# Patient Record
Sex: Female | Born: 1950 | Race: White | Hispanic: No | Marital: Single | Smoking: Light tobacco smoker
Health system: Southern US, Community
[De-identification: ages and names within clinical notes are randomized; demographics above are authoritative.]

## PROBLEM LIST (undated history)

## (undated) DIAGNOSIS — E559 Vitamin D deficiency, unspecified: Secondary | ICD-10-CM

## (undated) DIAGNOSIS — N189 Chronic kidney disease, unspecified: Secondary | ICD-10-CM

## (undated) DIAGNOSIS — R42 Dizziness and giddiness: Secondary | ICD-10-CM

## (undated) DIAGNOSIS — F419 Anxiety disorder, unspecified: Secondary | ICD-10-CM

## (undated) DIAGNOSIS — I10 Essential (primary) hypertension: Secondary | ICD-10-CM

## (undated) DIAGNOSIS — E785 Hyperlipidemia, unspecified: Secondary | ICD-10-CM

## (undated) DIAGNOSIS — J449 Chronic obstructive pulmonary disease, unspecified: Secondary | ICD-10-CM

## (undated) HISTORY — PX: OTHER SURGICAL HISTORY: SHX169

## (undated) HISTORY — PX: FOOT SURGERY: SHX648

## (undated) HISTORY — DX: Chronic obstructive pulmonary disease, unspecified: J44.9

## (undated) HISTORY — DX: Hyperlipidemia, unspecified: E78.5

## (undated) HISTORY — DX: Dizziness and giddiness: R42

## (undated) HISTORY — DX: Vitamin D deficiency, unspecified: E55.9

---

## 1998-03-19 HISTORY — PX: BREAST REDUCTION SURGERY: SHX8

## 1998-03-19 HISTORY — PX: REDUCTION MAMMAPLASTY: SUR839

## 2002-05-12 ENCOUNTER — Other Ambulatory Visit: Admission: RE | Admit: 2002-05-12 | Discharge: 2002-05-12 | Payer: Self-pay | Admitting: Gynecology

## 2005-12-03 ENCOUNTER — Ambulatory Visit (HOSPITAL_COMMUNITY): Admission: RE | Admit: 2005-12-03 | Discharge: 2005-12-03 | Payer: Self-pay | Admitting: Gastroenterology

## 2005-12-03 ENCOUNTER — Encounter (INDEPENDENT_AMBULATORY_CARE_PROVIDER_SITE_OTHER): Payer: Self-pay | Admitting: *Deleted

## 2005-12-03 ENCOUNTER — Ambulatory Visit: Payer: Self-pay | Admitting: Gastroenterology

## 2008-04-05 ENCOUNTER — Ambulatory Visit (HOSPITAL_COMMUNITY): Admission: RE | Admit: 2008-04-05 | Discharge: 2008-04-05 | Payer: Self-pay | Admitting: Family Medicine

## 2009-04-28 ENCOUNTER — Ambulatory Visit (HOSPITAL_COMMUNITY): Admission: RE | Admit: 2009-04-28 | Discharge: 2009-04-28 | Payer: Self-pay | Admitting: Family Medicine

## 2010-04-09 ENCOUNTER — Encounter: Payer: Self-pay | Admitting: Family Medicine

## 2010-06-06 ENCOUNTER — Other Ambulatory Visit (HOSPITAL_COMMUNITY): Payer: Self-pay | Admitting: *Deleted

## 2010-06-06 DIAGNOSIS — Z139 Encounter for screening, unspecified: Secondary | ICD-10-CM

## 2010-06-09 ENCOUNTER — Ambulatory Visit (HOSPITAL_COMMUNITY): Payer: PRIVATE HEALTH INSURANCE

## 2010-06-12 ENCOUNTER — Ambulatory Visit (HOSPITAL_COMMUNITY)
Admission: RE | Admit: 2010-06-12 | Discharge: 2010-06-12 | Disposition: A | Discharge status: Home / Self Care | Payer: PRIVATE HEALTH INSURANCE | Source: Ambulatory Visit | Attending: Family Medicine | Admitting: Family Medicine

## 2010-06-12 DIAGNOSIS — Z1231 Encounter for screening mammogram for malignant neoplasm of breast: Secondary | ICD-10-CM | POA: Insufficient documentation

## 2010-06-12 DIAGNOSIS — Z139 Encounter for screening, unspecified: Secondary | ICD-10-CM

## 2010-08-04 NOTE — Op Note (Signed)
NAME:  Samantha Bruce, Samantha Bruce           ACCOUNT NO.:  [id number]   MEDICAL RECORD NO.:  [id number]          PATIENT TYPE:  AMB   LOCATION:  DAY                           FACILITY:  [facility]   PHYSICIAN:  Kassie Mends, M.D.      DATE OF BIRTH:  Oct 24, 1950   DATE OF PROCEDURE:  12/03/2005  DATE OF DISCHARGE:                                 OPERATIVE REPORT   PROCEDURE:  Colonoscopy with cold forceps polypectomy.   INDICATION FOR EXAM:  Samantha Bruce is a 60 year old female who presents for  average risk colon cancer screening.   FINDINGS:  1. Two,  4-mm, sessile, rectal polyps removed via cold forceps biopsies.      Otherwise no masses, inflammatory changes, or vascular ectasia seen.  2. No diverticula or hemorrhoids evident.   RECOMMENDATIONS:  1..  High fiber diet and polyp handout given.  1. Will call patient with biopsy results.  If polyps adenomatous, then      next screening colonoscopy should be in 5 years.  All first degree      relatives (siblings and children) will need colon cancer screening at      age 75 if polyps are adenomatous.  2. Follow up with Dr. Samuel Jester.   MEDICATIONS:  1. Demerol 100 mg IV.  2. Versed 5 mg IV.   PROCEDURE TECHNIQUE:  A physical exam was performed and informed consent was  obtained from the patient after explaining the benefits, risks, and  alternatives to the procedure.  The patient was connected to the monitor and  placed in the left lateral position.  Continuous oxygen was provided by  nasal cannula and IV meds and administered via an indwelling cannula.  After  sedation and rectal exam, the patient's rectum was intubated.  The scope was  advanced under direct visualization to the cecum.  The scope was  subsequently removed slowly by carefully examining the color, texture,  anatomy, and integrity of the mucosa on the way out.  The patient was  recovered in the endoscopy suite; and discharged home in satisfactory  condition.      Kassie Mends, M.D.  Electronically Signed     SM/MEDQ  D:  12/04/2005  T:  12/05/2005  Job:  [id number]   cc:   Samuel Jester  Fax: [phone]

## 2012-04-08 ENCOUNTER — Other Ambulatory Visit (HOSPITAL_COMMUNITY): Payer: Self-pay | Admitting: Nurse Practitioner

## 2012-04-08 DIAGNOSIS — Z139 Encounter for screening, unspecified: Secondary | ICD-10-CM

## 2012-04-10 ENCOUNTER — Ambulatory Visit (HOSPITAL_COMMUNITY)
Admission: RE | Admit: 2012-04-10 | Discharge: 2012-04-10 | Disposition: A | Discharge status: Home / Self Care | Payer: PRIVATE HEALTH INSURANCE | Source: Ambulatory Visit | Attending: Nurse Practitioner | Admitting: Nurse Practitioner

## 2012-04-10 DIAGNOSIS — Z139 Encounter for screening, unspecified: Secondary | ICD-10-CM

## 2013-06-04 ENCOUNTER — Other Ambulatory Visit (HOSPITAL_COMMUNITY): Payer: Self-pay | Admitting: *Deleted

## 2013-06-04 DIAGNOSIS — Z1231 Encounter for screening mammogram for malignant neoplasm of breast: Secondary | ICD-10-CM

## 2013-06-09 ENCOUNTER — Ambulatory Visit (HOSPITAL_COMMUNITY)
Admission: RE | Admit: 2013-06-09 | Discharge: 2013-06-09 | Disposition: A | Discharge status: Home / Self Care | Payer: Medicare HMO | Source: Ambulatory Visit | Attending: *Deleted | Admitting: *Deleted

## 2013-06-09 DIAGNOSIS — Z1231 Encounter for screening mammogram for malignant neoplasm of breast: Secondary | ICD-10-CM | POA: Insufficient documentation

## 2013-06-12 ENCOUNTER — Other Ambulatory Visit (HOSPITAL_COMMUNITY): Payer: Self-pay | Admitting: *Deleted

## 2013-06-12 DIAGNOSIS — Z8249 Family history of ischemic heart disease and other diseases of the circulatory system: Secondary | ICD-10-CM

## 2013-06-12 DIAGNOSIS — I10 Essential (primary) hypertension: Secondary | ICD-10-CM

## 2013-06-18 ENCOUNTER — Ambulatory Visit (HOSPITAL_COMMUNITY)
Admission: RE | Admit: 2013-06-18 | Discharge: 2013-06-18 | Disposition: A | Discharge status: Home / Self Care | Payer: Medicare HMO | Source: Ambulatory Visit | Attending: *Deleted | Admitting: *Deleted

## 2013-06-18 DIAGNOSIS — Z8489 Family history of other specified conditions: Secondary | ICD-10-CM | POA: Insufficient documentation

## 2013-06-18 DIAGNOSIS — Z8249 Family history of ischemic heart disease and other diseases of the circulatory system: Secondary | ICD-10-CM

## 2013-06-18 DIAGNOSIS — I10 Essential (primary) hypertension: Secondary | ICD-10-CM | POA: Insufficient documentation

## 2013-08-28 ENCOUNTER — Encounter (HOSPITAL_COMMUNITY): Payer: Self-pay | Admitting: Emergency Medicine

## 2013-08-28 ENCOUNTER — Emergency Department (HOSPITAL_COMMUNITY)
Admission: EM | Admit: 2013-08-28 | Discharge: 2013-08-28 | Disposition: A | Discharge status: Home / Self Care | Payer: Medicare HMO | Attending: Emergency Medicine | Admitting: Emergency Medicine

## 2013-08-28 DIAGNOSIS — F17200 Nicotine dependence, unspecified, uncomplicated: Secondary | ICD-10-CM | POA: Insufficient documentation

## 2013-08-28 DIAGNOSIS — M542 Cervicalgia: Secondary | ICD-10-CM | POA: Insufficient documentation

## 2013-08-28 DIAGNOSIS — T148XXA Other injury of unspecified body region, initial encounter: Secondary | ICD-10-CM

## 2013-08-28 DIAGNOSIS — IMO0002 Reserved for concepts with insufficient information to code with codable children: Secondary | ICD-10-CM | POA: Insufficient documentation

## 2013-08-28 DIAGNOSIS — Z79899 Other long term (current) drug therapy: Secondary | ICD-10-CM | POA: Insufficient documentation

## 2013-08-28 DIAGNOSIS — Y929 Unspecified place or not applicable: Secondary | ICD-10-CM | POA: Insufficient documentation

## 2013-08-28 DIAGNOSIS — Y939 Activity, unspecified: Secondary | ICD-10-CM | POA: Insufficient documentation

## 2013-08-28 DIAGNOSIS — I10 Essential (primary) hypertension: Secondary | ICD-10-CM | POA: Insufficient documentation

## 2013-08-28 DIAGNOSIS — R51 Headache: Secondary | ICD-10-CM | POA: Insufficient documentation

## 2013-08-28 DIAGNOSIS — M25512 Pain in left shoulder: Secondary | ICD-10-CM

## 2013-08-28 DIAGNOSIS — X58XXXA Exposure to other specified factors, initial encounter: Secondary | ICD-10-CM | POA: Insufficient documentation

## 2013-08-28 DIAGNOSIS — Z791 Long term (current) use of non-steroidal anti-inflammatories (NSAID): Secondary | ICD-10-CM | POA: Insufficient documentation

## 2013-08-28 HISTORY — DX: Essential (primary) hypertension: I10

## 2013-08-28 MED ORDER — BACLOFEN 10 MG PO TABS: 10.0000 mg | ORAL_TABLET | Freq: Three times a day (TID) | ORAL | Status: AC

## 2013-08-28 MED ORDER — MELOXICAM 7.5 MG PO TABS: ORAL_TABLET | ORAL | Status: DC

## 2013-08-28 MED ORDER — ACETAMINOPHEN-CODEINE #3 300-30 MG PO TABS: 1.0000 | ORAL_TABLET | Freq: Four times a day (QID) | ORAL | Status: DC | PRN

## 2013-08-28 NOTE — ED Notes (Signed)
Pt has not taken b/p medicine this morning.

## 2013-08-28 NOTE — ED Provider Notes (Signed)
CSN: [id number]     Arrival date & time 08/28/13  [id number] History   First MD Initiated Contact with Patient 08/28/13 [phone]     Chief Complaint  Patient presents with  . Shoulder Pain     (Consider location/radiation/quality/duration/timing/severity/associated sxs/prior Treatment) HPI Comments: Left hand dominant.  Hx of bone spurs on the neck 10 yrs ago, surgery suggested, but never done.  Patient is a 63 y.o. female presenting with shoulder pain. The history is provided by the patient.  Shoulder Pain This is a chronic problem. The current episode started more than 1 year ago. The problem occurs intermittently. The problem has been gradually worsening. Associated symptoms include arthralgias, headaches and neck pain. Pertinent negatives include no abdominal pain, chest pain or coughing. Associated symptoms comments: Pain behind the head and extending from the shoulder to the neck. Collar bone also sore.. Exacerbated by: raising the left hand and movement. Treatments tried: tramadol and naprosyn. The treatment provided mild relief.    Past Medical History  Diagnosis Date  . Hypertension    History reviewed. No pertinent past surgical history. History reviewed. No pertinent family history. History  Substance Use Topics  . Smoking status: Light Tobacco Smoker  . Smokeless tobacco: Not on file  . Alcohol Use: No   OB History   Grav Para Term Preterm Abortions TAB SAB Ect Mult Living                 Review of Systems  Constitutional: Negative for activity change.       All ROS Neg except as noted in HPI  HENT: Negative for nosebleeds.   Eyes: Negative for photophobia and discharge.  Respiratory: Negative for cough, shortness of breath and wheezing.   Cardiovascular: Negative for chest pain and palpitations.  Gastrointestinal: Negative for abdominal pain and blood in stool.  Genitourinary: Negative for dysuria, frequency and hematuria.  Musculoskeletal: Positive for arthralgias and  neck pain. Negative for back pain.  Skin: Negative.   Neurological: Positive for headaches. Negative for dizziness, seizures and speech difficulty.  Psychiatric/Behavioral: Negative for hallucinations and confusion.      Allergies  Vicodin  Home Medications   Prior to Admission medications   Medication Sig Start Date End Date Taking? Authorizing Provider  atenolol-chlorthalidone (TENORETIC) 50-25 MG per tablet Take 1 tablet by mouth daily. 07/06/13  Yes Historical Provider, MD  diclofenac sodium (VOLTAREN) 1 % GEL Apply 2 g topically 4 (four) times daily.   Yes Historical Provider, MD  diphenhydramine-acetaminophen (TYLENOL PM) 25-500 MG TABS Take 1 tablet by mouth at bedtime as needed. sleep   Yes Historical Provider, MD  naproxen (NAPROSYN) 500 MG tablet Take 1,000 mg by mouth 2 (two) times daily with a meal.   Yes Historical Provider, MD  traMADol (ULTRAM) 50 MG tablet Take 50 mg by mouth every 6 (six) hours as needed. pain 07/29/13   Historical Provider, MD   BP 180/82  Pulse 52  Temp(Src) 97.9 F (36.6 C) (Oral)  Resp 20  Ht 5\' 7"  (1.702 m)  Wt 189 lb (85.73 kg)  BMI 29.59 kg/m2  SpO2 97% Physical Exam  Nursing note and vitals reviewed. Constitutional: She is oriented to person, place, and time. She appears well-developed and well-nourished.  Non-toxic appearance.  HENT:  Head: Normocephalic.  Right Ear: Tympanic membrane and external ear normal.  Left Ear: Tympanic membrane and external ear normal.  Eyes: EOM and lids are normal. Pupils are equal, round, and reactive to light.  Neck:  Normal range of motion. Neck supple. Carotid bruit is not present.  Cardiovascular: Normal rate, regular rhythm, normal heart sounds, intact distal pulses and normal pulses.   Pulmonary/Chest: Breath sounds normal. No respiratory distress.    Tenderness and pain to palpation and attempted ROM of the left shoulder, along the anterior and posterior shoulder, extending under the left arm. No  pain of the right shoulder.  Abdominal: Soft. Bowel sounds are normal. There is no tenderness. There is no guarding.  Musculoskeletal: Normal range of motion.  Lymphadenopathy:       Head (right side): No submandibular adenopathy present.       Head (left side): No submandibular adenopathy present.    She has no cervical adenopathy.  Neurological: She is alert and oriented to person, place, and time. She has normal strength. No cranial nerve deficit or sensory deficit.  Skin: Skin is warm and dry.  Psychiatric: She has a normal mood and affect. Her speech is normal.    ED Course  Procedures (including critical care time) Labs Review Labs Reviewed - No data to display  Imaging Review No results found.   EKG Interpretation None      MDM Electrocardiogram is negative for any acute event. Vital signs are within normal limits with exception of the pulse rate being 50 to him the blood pressure being elevated at 180/82. Pulse oximetry is 97% on room air. Within normal limits by my interpretation.  Patient has pain with range of motion of the shoulder. She has muscle related pain under the scapula and under the clavicle extending under the axilla. Suspect the patient has degenerative joint disease, muscle strain, and possible rotator cuff injury. No evidence of acute dislocation, or fracture, or vascular compromise. I've instructed the patient as to why we were not doing an x-ray of her shoulder. I have encouraged her to see an orthopedic specialist to see if orthopedics feels she needs something more conclusive such as an MR of the shoulder. A prescription for baclofen, Mobic, and Tylenol with Codeine have been given to the patient. Patient will followup with her primary physician for orthopedic referral.    Final diagnoses:  None    **I have reviewed nursing notes, vital signs, and all appropriate lab and imaging results for this patient.Kathie Dike, PA-C 08/28/13 1116

## 2013-08-28 NOTE — ED Notes (Signed)
Right shoulder pain times one year.  Diagnosed with bursitis.  Now pain is going into neck and she has some tingling is in the back of her head.

## 2013-08-28 NOTE — Discharge Instructions (Signed)
Your electrocardiogram is well within normal limits. There no acute changes noted on the EKG. Your examination is consistent with arthritis and muscle strain. Please use Mobic and baclofen daily. Please take these medications with food. Please use Tylenol codeine every 6 hours as needed for pain, this medication along with baclofen may cause drowsiness, please use caution getting around when taking this medication. Please see Dr. Charm Barges, or her designated physician if any changes, or problems, or return to the emergency department if any emergent problems.

## 2013-08-28 NOTE — ED Provider Notes (Signed)
Medical screening examination/treatment/procedure(s) were performed by non-physician practitioner and as supervising physician I was immediately available for consultation/collaboration.   EKG Interpretation   Date/Time:  Friday August 28 2013 10:39:58 EDT Ventricular Rate:  50 PR Interval:  167 QRS Duration: 91 QT Interval:  412 QTC Calculation: 376 R Axis:   30 Text Interpretation:  Sinus rhythm Confirmed by Wilkie Aye  MD, COURTNEY  ([id number]) on 08/28/2013 10:45:47 AM        Shon Baton, MD 08/28/13 1305

## 2014-09-01 ENCOUNTER — Other Ambulatory Visit (HOSPITAL_COMMUNITY): Payer: Self-pay | Admitting: *Deleted

## 2014-09-01 DIAGNOSIS — Z1231 Encounter for screening mammogram for malignant neoplasm of breast: Secondary | ICD-10-CM

## 2014-09-13 ENCOUNTER — Ambulatory Visit (HOSPITAL_COMMUNITY)
Admission: RE | Admit: 2014-09-13 | Discharge: 2014-09-13 | Disposition: A | Discharge status: Home / Self Care | Payer: Medicare HMO | Source: Ambulatory Visit | Attending: *Deleted | Admitting: *Deleted

## 2014-09-13 ENCOUNTER — Other Ambulatory Visit (HOSPITAL_COMMUNITY): Payer: Self-pay | Admitting: *Deleted

## 2014-09-13 DIAGNOSIS — Z1231 Encounter for screening mammogram for malignant neoplasm of breast: Secondary | ICD-10-CM | POA: Diagnosis not present

## 2014-12-23 DIAGNOSIS — Z23 Encounter for immunization: Secondary | ICD-10-CM | POA: Diagnosis not present

## 2015-02-07 DIAGNOSIS — I10 Essential (primary) hypertension: Secondary | ICD-10-CM | POA: Diagnosis not present

## 2015-03-24 DIAGNOSIS — Z0100 Encounter for examination of eyes and vision without abnormal findings: Secondary | ICD-10-CM | POA: Diagnosis not present

## 2015-03-24 DIAGNOSIS — I10 Essential (primary) hypertension: Secondary | ICD-10-CM | POA: Diagnosis not present

## 2015-03-24 DIAGNOSIS — H5200 Hypermetropia, unspecified eye: Secondary | ICD-10-CM | POA: Diagnosis not present

## 2015-04-12 DIAGNOSIS — R69 Illness, unspecified: Secondary | ICD-10-CM | POA: Diagnosis not present

## 2015-05-27 DIAGNOSIS — H6121 Impacted cerumen, right ear: Secondary | ICD-10-CM | POA: Diagnosis not present

## 2015-05-27 DIAGNOSIS — E559 Vitamin D deficiency, unspecified: Secondary | ICD-10-CM | POA: Diagnosis not present

## 2015-05-27 DIAGNOSIS — J449 Chronic obstructive pulmonary disease, unspecified: Secondary | ICD-10-CM | POA: Diagnosis not present

## 2015-05-27 DIAGNOSIS — E782 Mixed hyperlipidemia: Secondary | ICD-10-CM | POA: Diagnosis not present

## 2015-05-27 DIAGNOSIS — I10 Essential (primary) hypertension: Secondary | ICD-10-CM | POA: Diagnosis not present

## 2015-05-27 DIAGNOSIS — E538 Deficiency of other specified B group vitamins: Secondary | ICD-10-CM | POA: Diagnosis not present

## 2015-07-06 DIAGNOSIS — H3562 Retinal hemorrhage, left eye: Secondary | ICD-10-CM | POA: Diagnosis not present

## 2015-07-19 DIAGNOSIS — L309 Dermatitis, unspecified: Secondary | ICD-10-CM | POA: Diagnosis not present

## 2015-07-19 DIAGNOSIS — H3562 Retinal hemorrhage, left eye: Secondary | ICD-10-CM | POA: Diagnosis not present

## 2015-07-19 DIAGNOSIS — M1712 Unilateral primary osteoarthritis, left knee: Secondary | ICD-10-CM | POA: Diagnosis not present

## 2015-07-20 DIAGNOSIS — G500 Trigeminal neuralgia: Secondary | ICD-10-CM | POA: Diagnosis not present

## 2015-10-13 DIAGNOSIS — I10 Essential (primary) hypertension: Secondary | ICD-10-CM | POA: Diagnosis not present

## 2015-10-13 DIAGNOSIS — E538 Deficiency of other specified B group vitamins: Secondary | ICD-10-CM | POA: Diagnosis not present

## 2015-10-13 DIAGNOSIS — E559 Vitamin D deficiency, unspecified: Secondary | ICD-10-CM | POA: Diagnosis not present

## 2015-10-13 DIAGNOSIS — E782 Mixed hyperlipidemia: Secondary | ICD-10-CM | POA: Diagnosis not present

## 2015-10-19 DIAGNOSIS — R69 Illness, unspecified: Secondary | ICD-10-CM | POA: Diagnosis not present

## 2015-10-26 ENCOUNTER — Encounter: Payer: Self-pay | Admitting: Cardiovascular Disease

## 2015-10-26 ENCOUNTER — Ambulatory Visit (INDEPENDENT_AMBULATORY_CARE_PROVIDER_SITE_OTHER): Payer: Medicare HMO | Admitting: Cardiovascular Disease

## 2015-10-26 VITALS — BP 180/100 | HR 64 | Ht 67.0 in | Wt 180.0 lb

## 2015-10-26 DIAGNOSIS — R0789 Other chest pain: Secondary | ICD-10-CM

## 2015-10-26 DIAGNOSIS — I10 Essential (primary) hypertension: Secondary | ICD-10-CM

## 2015-10-26 DIAGNOSIS — Z136 Encounter for screening for cardiovascular disorders: Secondary | ICD-10-CM | POA: Diagnosis not present

## 2015-10-26 DIAGNOSIS — R0609 Other forms of dyspnea: Secondary | ICD-10-CM | POA: Diagnosis not present

## 2015-10-26 MED ORDER — AMLODIPINE BESYLATE 10 MG PO TABS: 10.0000 mg | ORAL_TABLET | Freq: Every day | ORAL | 6 refills | Status: DC

## 2015-10-26 NOTE — Addendum Note (Signed)
Addended by: Lesle Chris on: 10/26/2015 03:18 PM   Modules accepted: Orders

## 2015-10-26 NOTE — Progress Notes (Signed)
CARDIOLOGY CONSULT NOTE  Patient ID: Samantha Bruce MRN: [id number] DOB/AGE: 1950-12-14 65 y.o.  Admit date: (Not on file) Primary Physician: Samuel Jester, DO Referring Physician:   Reason for Consultation: HTN  HPI: The patient is a 66 year old woman with hypertension.  She is here with her daughter who says her mother stays stressed-out all the time. The patient complains of intermittent retrosternal chest pains. She has exertional dyspnea halfway up a flight of stairs. She denies orthopnea and PND. She has been on amlodipine 5 mg for 10 days and Tenoretic for 8 years. Her systolic blood pressure is routinely higher than 200.  ECG performed in the office today which I personally reviewed demonstrates normal sinus rhythm with no ischemic ST segment or T-wave abnormalities, nor any arrhythmias.     Allergies  Allergen Reactions  . Vicodin [Hydrocodone-Acetaminophen] Rash    Current Outpatient Prescriptions  Medication Sig Dispense Refill  . acetaminophen-codeine (TYLENOL #3) 300-30 MG per tablet Take 1 tablet by mouth every 6 (six) hours as needed for moderate pain. 16 tablet 0  . amLODipine (NORVASC) 5 MG tablet Take 5 mg by mouth daily.    Marland Kitchen aspirin 81 MG tablet Take 81 mg by mouth daily.    Marland Kitchen atenolol-chlorthalidone (TENORETIC) 50-25 MG per tablet Take 1 tablet by mouth daily.    . diclofenac sodium (VOLTAREN) 1 % GEL Apply 2 g topically 4 (four) times daily.    Marland Kitchen escitalopram (LEXAPRO) 5 MG tablet Take 5 mg by mouth daily.    . naproxen (NAPROSYN) 500 MG tablet Take 1,000 mg by mouth 2 (two) times daily with a meal.    . Omega-3 Fatty Acids (FISH OIL) 1200 MG CAPS Take by mouth.    . traMADol (ULTRAM) 50 MG tablet Take 50 mg by mouth every 6 (six) hours as needed. pain     No current facility-administered medications for this visit.     Past Medical History:  Diagnosis Date  . Hypertension     Past Surgical History:  Procedure Laterality Date  .  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 Marital status: Married    Spouse name: N/A  . Number of children: N/A  . Years of education: N/A   Occupational History  . Not on file.   Social History Main Topics  . Smoking status: Light Tobacco Smoker  . Smokeless tobacco: Never Used  . Alcohol use No  . Drug use: No  . Sexual activity: Not on file   Other Topics Concern  . Not on file   Social History Narrative  . No narrative on file     No family history of premature CAD in 1st degree relatives.  Prior to Admission medications   Medication Sig Start Date End Date Taking? Authorizing Provider  acetaminophen-codeine (TYLENOL #3) 300-30 MG per tablet Take 1 tablet by mouth every 6 (six) hours as needed for moderate pain. 08/28/13  Yes Ivery Quale, PA-C  amLODipine (NORVASC) 5 MG tablet Take 5 mg by mouth daily.   Yes Historical Provider, MD  aspirin 81 MG tablet Take 81 mg by mouth daily.   Yes Historical Provider, MD  atenolol-chlorthalidone (TENORETIC) 50-25 MG per tablet Take 1 tablet by mouth daily. 07/06/13  Yes Historical Provider, MD  diclofenac sodium (VOLTAREN) 1 % GEL Apply 2 g topically 4 (four) times daily.  Yes Historical Provider, MD  escitalopram (LEXAPRO) 5 MG tablet Take 5 mg by mouth daily.   Yes Historical Provider, MD  naproxen (NAPROSYN) 500 MG tablet Take 1,000 mg by mouth 2 (two) times daily with a meal.   Yes Historical Provider, MD  Omega-3 Fatty Acids (FISH OIL) 1200 MG CAPS Take by mouth.   Yes Historical Provider, MD  traMADol (ULTRAM) 50 MG tablet Take 50 mg by mouth every 6 (six) hours as needed. pain 07/29/13  Yes Historical Provider, MD     Review of systems complete and found to be negative unless listed above in HPI     Physical exam Blood pressure (!) 180/100, pulse 64, height  (1.702 m), weight 180 lb (81.6 kg), SpO2 97 %. General: NAD Neck: No JVD, no  thyromegaly or thyroid nodule.  Lungs: Clear to auscultation bilaterally with normal respiratory effort. CV: Nondisplaced PMI. Regular rate and rhythm, normal S1/S2, no S3/S4, no murmur.  No peripheral edema.  No carotid bruit.  Normal pedal pulses.  Abdomen: Soft, nontender, no distention.  Skin: Intact without lesions or rashes.  Neurologic: Alert and oriented x 3.  Psych: Normal affect. Extremities: No clubbing or cyanosis.  HEENT: Normal.   ECG: Most recent ECG reviewed.  Labs:  No results found for: WBC, HGB, HCT, MCV, PLT No results for input(s): NA, K, CL, CO2, BUN, CREATININE, CALCIUM, PROT, BILITOT, ALKPHOS, ALT, AST, GLUCOSE in the last 168 hours.  Invalid input(s): LABALBU No results found for: CKTOTAL, CKMB, CKMBINDEX, TROPONINI No results found for: CHOL No results found for: HDL No results found for: LDLCALC No results found for: TRIG No results found for: CHOLHDL No results found for: LDLDIRECT       Studies: No results found.  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  2. Chest pain/DOE: Likely related to hypertensive heart disease. Will increase amlodipine to 10 mg daily. I will order a 2-D echocardiogram with Doppler to evaluate cardiac structure, function, and regional wall motion.  Dispo: fu 6 weeks.   Signed: Prentice Docker, M.D., F.A.C.C.  10/26/2015, 2:54 PM

## 2015-10-26 NOTE — Patient Instructions (Signed)
Medication Instructions:   Increase Norvasc to  daily.  Continue all other medications.    Labwork: none  Testing/Procedures:  Your physician has requested that you have an echocardiogram. Echocardiography is a painless test that uses sound waves to create images of your heart. It provides your doctor with information about the size and shape of your heart and how well your heart's chambers and valves are working. This procedure takes approximately one hour. There are no restrictions for this procedure.  Office will contact with results via phone or letter.    Follow-Up: 6 weeks   Any Other Special Instructions Will Be Listed Below (If Applicable).  If you need a refill on your cardiac medications before your next appointment, please call your pharmacy.

## 2015-11-17 ENCOUNTER — Other Ambulatory Visit: Payer: Self-pay

## 2015-11-17 ENCOUNTER — Ambulatory Visit (INDEPENDENT_AMBULATORY_CARE_PROVIDER_SITE_OTHER): Payer: Medicare HMO

## 2015-11-17 DIAGNOSIS — I10 Essential (primary) hypertension: Secondary | ICD-10-CM

## 2015-11-17 DIAGNOSIS — R0789 Other chest pain: Secondary | ICD-10-CM

## 2015-11-17 LAB — ECHOCARDIOGRAM COMPLETE
E/e' ratio: 15.22
EWDT: 185 ms
FS: 38 % (ref 28–44)
IVS/LV PW RATIO, ED: 0.81
LA diam end sys: 38 mm
LA diam index: 1.97 cm/m2
LA vol index: 23.4 mL/m2
LASIZE: 38 mm
LAVOL: 45.2 mL
LAVOLA4C: 46.6 mL
LV E/e' medial: 15.22
LV TDI E'LATERAL: 5.8
LV TDI E'MEDIAL: 5.42
LV dias vol index: 39 mL/m2
LV dias vol: 75 mL (ref 46–106)
LV e' LATERAL: 5.8 cm/s
LVEEAVG: 15.22
LVOT SV: 65 mL
LVOT VTI: 22.8 cm
LVOT area: 2.84 cm2
LVOT diameter: 19 mm
LVOTPV: 90.2 cm/s
LVSYSVOL: 24 mL (ref 14–42)
LVSYSVOLIN: 12 mL/m2
MV Dec: 185
MV Peak grad: 3 mmHg
MVPKAVEL: 107 m/s
MVPKEVEL: 88.3 m/s
PW: 10.8 mm — AB (ref 0.6–1.1)
RV TAPSE: 21.7 mm
Simpson's disk: 68
Stroke v: 51 ml

## 2015-11-18 ENCOUNTER — Telehealth: Payer: Self-pay | Admitting: *Deleted

## 2015-11-18 NOTE — Telephone Encounter (Signed)
Notes Recorded by Lesle Chris, LPN on 05/22/5730 at 9:28 AM EDT Patient notified and verbalized understanding. Copy to pmd. Follow up scheduled with Dr. Purvis Sheffield on 12/08/2015. ------  Notes Recorded by Jodelle Gross, NP on 11/17/2015 at 4:42 PM EDT Reviewed echo results. Some AoV calcifications, but no issues related to it. Good contraction with mild stiffening on relaxation related to history of hypertension. No changes in regimen.

## 2015-12-08 ENCOUNTER — Ambulatory Visit (INDEPENDENT_AMBULATORY_CARE_PROVIDER_SITE_OTHER): Payer: Medicare HMO | Admitting: Cardiovascular Disease

## 2015-12-08 ENCOUNTER — Other Ambulatory Visit (HOSPITAL_COMMUNITY): Payer: Self-pay | Admitting: *Deleted

## 2015-12-08 ENCOUNTER — Encounter: Payer: Self-pay | Admitting: Cardiovascular Disease

## 2015-12-08 VITALS — BP 132/102 | HR 65 | Ht 66.0 in | Wt 168.0 lb

## 2015-12-08 DIAGNOSIS — Z1231 Encounter for screening mammogram for malignant neoplasm of breast: Secondary | ICD-10-CM

## 2015-12-08 DIAGNOSIS — I10 Essential (primary) hypertension: Secondary | ICD-10-CM

## 2015-12-08 DIAGNOSIS — R0789 Other chest pain: Secondary | ICD-10-CM

## 2015-12-08 DIAGNOSIS — R0609 Other forms of dyspnea: Secondary | ICD-10-CM

## 2015-12-08 MED ORDER — VALSARTAN 80 MG PO TABS: 80.0000 mg | ORAL_TABLET | Freq: Every day | ORAL | 6 refills | Status: DC

## 2015-12-08 NOTE — Progress Notes (Signed)
      SUBJECTIVE: The patient returns for follow-up after undergoing cardiovascular testing performed for the evaluation of chest pain and exertional dyspnea.  Echocardiogram 11/17/15 normal left ventricular systolic function with upper normal left ventricular wall thickness, LVEF 60-65%, grade 1 diastolic dysfunction, mild mitral regurgitation, mitral annular calcification and sclerotic aortic annulus.  She just returned from a trip to [place] and feels fatigued. Chest pain and shortness of breath have improved.   Review of Systems: As per "subjective", otherwise negative.  Allergies  Allergen Reactions  . Vicodin [Hydrocodone-Acetaminophen] Rash    Current Outpatient Prescriptions  Medication Sig Dispense Refill  . acetaminophen-codeine (TYLENOL #3) 300-30 MG per tablet Take 1 tablet by mouth every 6 (six) hours as needed for moderate pain. 16 tablet 0  . amLODipine (NORVASC) 10 MG tablet Take 1 tablet (10 mg total) by mouth daily. 30 tablet 6  . aspirin 81 MG tablet Take 81 mg by mouth daily.    Marland Kitchen atenolol-chlorthalidone (TENORETIC) 50-25 MG per tablet Take 1 tablet by mouth daily.    . diclofenac sodium (VOLTAREN) 1 % GEL Apply 2 g topically 4 (four) times daily.    Marland Kitchen escitalopram (LEXAPRO) 5 MG tablet Take 5 mg by mouth daily.    . naproxen (NAPROSYN) 500 MG tablet Take 1,000 mg by mouth 2 (two) times daily with a meal.    . Omega-3 Fatty Acids (FISH OIL) 1200 MG CAPS Take by mouth.    . traMADol (ULTRAM) 50 MG tablet Take 50 mg by mouth every 6 (six) hours as needed. pain     No current facility-administered medications for this visit.     Past Medical History:  Diagnosis Date  . Hypertension     Past Surgical History:  Procedure Laterality Date  . 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 Marital status: Married    Spouse name: N/A  . Number of children: N/A  . Years of education: N/A    Occupational History  . Not on file.   Social History Main Topics  . Smoking status: Light Tobacco Smoker  . Smokeless tobacco: Never Used  . Alcohol use No  . Drug use: No  . Sexual activity: Not on file   Other Topics Concern  . Not on file   Social History Narrative  . No narrative on file     Vitals:   12/08/15 0914  BP: (!) 132/102  Pulse: 65  SpO2: 95%  Weight: 168 lb (76.2 kg)  Height: 5\' 6"  (1.676 m)    PHYSICAL EXAM General: NAD HEENT: Normal. Neck: No JVD, no thyromegaly. Lungs: Clear to auscultation bilaterally with normal respiratory effort. CV: Nondisplaced PMI.  Regular rate and rhythm, normal S1/S2, no S3/S4, no murmur. No pretibial or periankle edema.     Abdomen: Soft, nontender, no distention.  Neurologic: Alert and oriented.  Psych: Normal affect. Skin: Normal. Musculoskeletal: No gross deformities.    ECG: Most recent ECG reviewed.      ASSESSMENT AND PLAN: 1. Malignant HTN: DBP remains elevated. I will add Diovan 80 mg. Will obtain BMET in one week.  2. Chest pain/DOE: Symptoms have improved. Likely related to hypertensive heart disease. I will add Diovan 80 mg. Will obtain BMET in one week.  Dispo: fu 2 months.   Prentice Docker, M.D., F.A.C.C.

## 2015-12-08 NOTE — Patient Instructions (Signed)
Medication Instructions:   Begin Diovan 80mg  daily.  Continue all other medications.    Labwork: BMET - due in 5 days, order given today. Office will contact with results via phone or letter.    Testing/Procedures: none  Follow-Up: 2 months   Any Other Special Instructions Will Be Listed Below (If Applicable).  If you need a refill on your cardiac medications before your next appointment, please call your pharmacy.

## 2015-12-09 ENCOUNTER — Ambulatory Visit (HOSPITAL_COMMUNITY)
Admission: RE | Admit: 2015-12-09 | Discharge: 2015-12-09 | Disposition: A | Discharge status: Home / Self Care | Payer: Medicare HMO | Source: Ambulatory Visit | Attending: *Deleted | Admitting: *Deleted

## 2015-12-09 DIAGNOSIS — Z1231 Encounter for screening mammogram for malignant neoplasm of breast: Secondary | ICD-10-CM

## 2015-12-14 DIAGNOSIS — E538 Deficiency of other specified B group vitamins: Secondary | ICD-10-CM | POA: Diagnosis not present

## 2015-12-14 DIAGNOSIS — I10 Essential (primary) hypertension: Secondary | ICD-10-CM | POA: Diagnosis not present

## 2015-12-14 DIAGNOSIS — E782 Mixed hyperlipidemia: Secondary | ICD-10-CM | POA: Diagnosis not present

## 2015-12-14 DIAGNOSIS — E559 Vitamin D deficiency, unspecified: Secondary | ICD-10-CM | POA: Diagnosis not present

## 2015-12-15 DIAGNOSIS — I10 Essential (primary) hypertension: Secondary | ICD-10-CM | POA: Diagnosis not present

## 2015-12-23 ENCOUNTER — Telehealth: Payer: Self-pay | Admitting: Cardiovascular Disease

## 2015-12-23 NOTE — Telephone Encounter (Signed)
That is fine 

## 2015-12-23 NOTE — Telephone Encounter (Signed)
Pt says she doesn't want to start new medication. Says she wants to take 1/2 of valsartan. Will forward to Dr. Purvis SheffieldKoneswaran

## 2015-12-23 NOTE — Telephone Encounter (Signed)
Try Edarbi 80 mg 

## 2015-12-23 NOTE — Telephone Encounter (Signed)
Samantha Bruce called stating that since she stated taking the Diovan 80mg  daily she has noticed that she is very fatigue, wants to sleep and coughing.  States she is also having shortness of breath.    Please call # [phone].

## 2015-12-23 NOTE — Telephone Encounter (Signed)
LM to call back.

## 2015-12-26 NOTE — Telephone Encounter (Signed)
Pt aware will update [place] in 1 week if  Taking 1/2 relieves symptoms

## 2016-01-05 DIAGNOSIS — R69 Illness, unspecified: Secondary | ICD-10-CM | POA: Diagnosis not present

## 2016-02-13 ENCOUNTER — Ambulatory Visit: Payer: Medicare HMO | Admitting: Cardiovascular Disease

## 2016-02-29 DIAGNOSIS — E538 Deficiency of other specified B group vitamins: Secondary | ICD-10-CM | POA: Diagnosis not present

## 2016-02-29 DIAGNOSIS — I10 Essential (primary) hypertension: Secondary | ICD-10-CM | POA: Diagnosis not present

## 2016-02-29 DIAGNOSIS — E559 Vitamin D deficiency, unspecified: Secondary | ICD-10-CM | POA: Diagnosis not present

## 2016-02-29 DIAGNOSIS — R42 Dizziness and giddiness: Secondary | ICD-10-CM | POA: Diagnosis not present

## 2016-02-29 DIAGNOSIS — H3562 Retinal hemorrhage, left eye: Secondary | ICD-10-CM | POA: Diagnosis not present

## 2016-02-29 DIAGNOSIS — R69 Illness, unspecified: Secondary | ICD-10-CM | POA: Diagnosis not present

## 2016-02-29 DIAGNOSIS — R5382 Chronic fatigue, unspecified: Secondary | ICD-10-CM | POA: Diagnosis not present

## 2016-02-29 DIAGNOSIS — H8113 Benign paroxysmal vertigo, bilateral: Secondary | ICD-10-CM | POA: Diagnosis not present

## 2016-02-29 DIAGNOSIS — E782 Mixed hyperlipidemia: Secondary | ICD-10-CM | POA: Diagnosis not present

## 2016-02-29 DIAGNOSIS — J449 Chronic obstructive pulmonary disease, unspecified: Secondary | ICD-10-CM | POA: Diagnosis not present

## 2016-03-15 ENCOUNTER — Ambulatory Visit (INDEPENDENT_AMBULATORY_CARE_PROVIDER_SITE_OTHER): Payer: Medicare HMO | Admitting: Cardiovascular Disease

## 2016-03-15 ENCOUNTER — Encounter: Payer: Self-pay | Admitting: Cardiovascular Disease

## 2016-03-15 VITALS — BP 149/83 | HR 55 | Ht 66.0 in | Wt 185.0 lb

## 2016-03-15 DIAGNOSIS — R42 Dizziness and giddiness: Secondary | ICD-10-CM | POA: Diagnosis not present

## 2016-03-15 DIAGNOSIS — I10 Essential (primary) hypertension: Secondary | ICD-10-CM

## 2016-03-15 NOTE — Addendum Note (Signed)
Addended by: Burman Nieves T on: 03/15/2016 03:10 PM   Modules accepted: Orders

## 2016-03-15 NOTE — Patient Instructions (Signed)

## 2016-03-15 NOTE — Progress Notes (Addendum)
SUBJECTIVE: The patient presents for follow-up of malignant hypertension. She denies chest pain. She has a history of vertigo and has been dizzy lately. Atenolol dose recently increased by PCP.   Review of Systems: As per "subjective", otherwise negative.  Allergies  Allergen Reactions  . Vicodin [Hydrocodone-Acetaminophen] Rash    Current Outpatient Prescriptions  Medication Sig Dispense Refill  . acetaminophen-codeine (TYLENOL #3) 300-30 MG per tablet Take 1 tablet by mouth every 6 (six) hours as needed for moderate pain. 16 tablet 0  . amLODipine (NORVASC) 10 MG tablet Take 1 tablet (10 mg total) by mouth daily. 30 tablet 6  . aspirin 81 MG tablet Take 81 mg by mouth daily as needed.     Marland Kitchen. atenolol-chlorthalidone (TENORETIC) 50-25 MG per tablet Take 1 tablet by mouth daily.    . diclofenac sodium (VOLTAREN) 1 % GEL Apply 2 g topically 4 (four) times daily.    Marland Kitchen. escitalopram (LEXAPRO) 5 MG tablet Take 5 mg by mouth daily.    . naproxen (NAPROSYN) 500 MG tablet Take 1,000 mg by mouth 2 (two) times daily as needed.     . Omega-3 Fatty Acids (FISH OIL) 1200 MG CAPS Take by mouth.    . traMADol (ULTRAM) 50 MG tablet Take 50 mg by mouth every 6 (six) hours as needed. pain    . valsartan (DIOVAN) 80 MG tablet Take 1 tablet (80 mg total) by mouth daily. 30 tablet 6  . VENTOLIN HFA 108 (90 Base) MCG/ACT inhaler Inhale 2 puffs into the lungs every 4 (four) hours as needed.    Marland Kitchen. atenolol-chlorthalidone (TENORETIC) 100-25 MG tablet Take 1 tablet by mouth daily. Has not started this dose yet     No current facility-administered medications for this visit.     Past Medical History:  Diagnosis Date  . Hypertension     Past Surgical History:  Procedure Laterality Date  . 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 Marital status: Married    Spouse name: N/A  . Number of children: N/A  . Years of education:  N/A   Occupational History  . Not on file.   Social History Main Topics  . Smoking status: Light Tobacco Smoker    Types: Cigarettes    Start date: 04/16/1973  . Smokeless tobacco: Never Used  . Alcohol use No  . Drug use: No  . Sexual activity: Not on file   Other Topics Concern  . Not on file   Social History Narrative  . No narrative on file     Vitals:   03/15/16 1443  BP: (!) 149/83  Pulse: (!) 55  SpO2: 100%  Weight: 185 lb (83.9 kg)  Height: 5\' 6"  (1.676 m)    PHYSICAL EXAM General: NAD HEENT: Normal. Neck: No JVD, no thyromegaly. Lungs: Clear to auscultation bilaterally with normal respiratory effort. CV: Nondisplaced PMI.  Regular rate and rhythm, normal S1/S2, no S3/S4, no murmur. No pretibial or periankle edema.  No carotid bruit.   Abdomen: Soft, nontender, no distention.  Neurologic: Alert and oriented.  Psych: Normal affect. Skin: Normal. Musculoskeletal: No gross deformities.    ECG: Most recent ECG reviewed.      ASSESSMENT AND PLAN: 1. Malignant HTN: Mildly elevated. Atenolol dose recently increased by PCP. No changes today.  2. Vertigo: Will make ENT referral.  Dispo: fu 6 months  Darliss RidgelSuresh  Bronson Ing, M.D., F.A.C.C.

## 2016-06-08 DIAGNOSIS — Z0100 Encounter for examination of eyes and vision without abnormal findings: Secondary | ICD-10-CM | POA: Diagnosis not present

## 2016-06-08 DIAGNOSIS — H2510 Age-related nuclear cataract, unspecified eye: Secondary | ICD-10-CM | POA: Diagnosis not present

## 2016-06-19 DIAGNOSIS — R69 Illness, unspecified: Secondary | ICD-10-CM | POA: Diagnosis not present

## 2016-06-26 DIAGNOSIS — I10 Essential (primary) hypertension: Secondary | ICD-10-CM | POA: Diagnosis not present

## 2016-06-26 DIAGNOSIS — G933 Postviral fatigue syndrome: Secondary | ICD-10-CM | POA: Diagnosis not present

## 2016-06-26 DIAGNOSIS — Z6827 Body mass index (BMI) 27.0-27.9, adult: Secondary | ICD-10-CM | POA: Diagnosis not present

## 2016-06-26 DIAGNOSIS — E785 Hyperlipidemia, unspecified: Secondary | ICD-10-CM | POA: Diagnosis not present

## 2016-06-26 DIAGNOSIS — R42 Dizziness and giddiness: Secondary | ICD-10-CM | POA: Diagnosis not present

## 2016-06-26 DIAGNOSIS — E559 Vitamin D deficiency, unspecified: Secondary | ICD-10-CM | POA: Diagnosis not present

## 2016-06-26 DIAGNOSIS — Z79899 Other long term (current) drug therapy: Secondary | ICD-10-CM | POA: Diagnosis not present

## 2016-06-29 DIAGNOSIS — I10 Essential (primary) hypertension: Secondary | ICD-10-CM | POA: Diagnosis not present

## 2016-06-29 DIAGNOSIS — E559 Vitamin D deficiency, unspecified: Secondary | ICD-10-CM | POA: Diagnosis not present

## 2016-06-29 DIAGNOSIS — E785 Hyperlipidemia, unspecified: Secondary | ICD-10-CM | POA: Diagnosis not present

## 2016-06-29 DIAGNOSIS — Z79899 Other long term (current) drug therapy: Secondary | ICD-10-CM | POA: Diagnosis not present

## 2016-06-29 DIAGNOSIS — R42 Dizziness and giddiness: Secondary | ICD-10-CM | POA: Diagnosis not present

## 2016-06-29 DIAGNOSIS — G933 Postviral fatigue syndrome: Secondary | ICD-10-CM | POA: Diagnosis not present

## 2016-07-25 ENCOUNTER — Encounter: Payer: Self-pay | Admitting: *Deleted

## 2016-07-27 ENCOUNTER — Ambulatory Visit (INDEPENDENT_AMBULATORY_CARE_PROVIDER_SITE_OTHER): Payer: Medicare HMO | Admitting: Diagnostic Neuroimaging

## 2016-07-27 ENCOUNTER — Encounter: Payer: Self-pay | Admitting: Diagnostic Neuroimaging

## 2016-07-27 ENCOUNTER — Encounter (INDEPENDENT_AMBULATORY_CARE_PROVIDER_SITE_OTHER): Payer: Self-pay

## 2016-07-27 VITALS — BP 161/81 | HR 52 | Ht 67.0 in | Wt 175.4 lb

## 2016-07-27 DIAGNOSIS — G450 Vertebro-basilar artery syndrome: Secondary | ICD-10-CM

## 2016-07-27 DIAGNOSIS — G459 Transient cerebral ischemic attack, unspecified: Secondary | ICD-10-CM | POA: Diagnosis not present

## 2016-07-27 DIAGNOSIS — R42 Dizziness and giddiness: Secondary | ICD-10-CM

## 2016-07-27 NOTE — Patient Instructions (Signed)
Thank you for coming to see [place] at [facility]. I hope we have been able to provide you high quality care today.  You may receive a patient satisfaction survey over the next few weeks. We would appreciate your feedback and comments so that we may continue to improve ourselves and the health of our patients.  - check MRI brain, MRA head, MRA neck - continue aspirin 71m daily, fish oil, BP control meds - smoking cessation reviewed   ~~~~~~~~~~~~~~~~~~~~~~~~~~~~~~~~~~~~~~~~~~~~~~~~~~~~~~~~~~~~~~~~~  DR. Shawni Volkov'S GUIDE TO HAPPY AND HEALTHY LIVING These are some of my general health and wellness recommendations. Some of them may apply to you better than others. Please use common sense as you try these suggestions and feel free to ask me any questions.   ACTIVITY/FITNESS Mental, social, emotional and physical stimulation are very important for brain and body health. Try learning a new activity (arts, music, language, sports, games).  Keep moving your body to the best of your abilities. You can do this at home, inside or outside, the park, community center, gym or anywhere you like. Consider a physical therapist or personal trainer to get started. Consider the app Sworkit. Fitness trackers such as smart-watches, smart-phones or Fitbits can help as well.   NUTRITION Eat more plants: colorful vegetables, nuts, seeds and berries.  Eat less sugar, salt, preservatives and processed foods.  Avoid toxins such as cigarettes and alcohol.  Drink water when you are thirsty. Warm water with a slice of lemon is an excellent morning drink to start the day.  Consider these websites for more information The Nutrition Source (hhttps://www.henry-hernandez.biz/ Precision Nutrition (wWindowBlog.ch   RELAXATION Consider practicing mindfulness meditation or other relaxation techniques such as deep breathing, prayer, yoga, tai chi, massage. See  website mindful.org or the apps Headspace or Calm to help get started.   SLEEP Try to get at least 7-8+ hours sleep per day. Regular exercise and reduced caffeine will help you sleep better. Practice good sleep hygeine techniques. See website sleep.org for more information.   PLANNING Prepare estate planning, living will, healthcare POA documents. Sometimes this is best planned with the help of an attorney. Theconversationproject.org and agingwithdignity.org are excellent resources.

## 2016-07-27 NOTE — Progress Notes (Signed)
[facility]  PATIENT: Samantha Bruce DOB: Mar 31, 1950  REFERRING CLINICIAN: Georgiana Shore HISTORY FROM: patient and daughter  REASON FOR VISIT: new consult    HISTORICAL  CHIEF COMPLAINT:  Chief Complaint  Patient presents with  . NP  Charm Barges  . Dizziness    vertigo, and L arm weakness     HISTORY OF PRESENT ILLNESS:   66 year old female here for evaluation of dizziness and vertigo. Patient has history of uncontrolled hypertension and tobacco abuse.  Patient has at least 20 year history of intermittent vertigo symptoms which she describes as intermittent room spinning sensation, nausea, vomiting, ounce difficulty. Sometimes this is triggered by head position movement sometimes without any specific cause. Patient's daughter also has similar vertigo problems.  Over the past 1 month patient has had increasing attacks of vertigo, lightheadedness, and one episode of left arm and left leg numbness associated with slurred speech. Symptoms lasted for 2 weeks and have gradually improved. Patient was dehydrated that time.    REVIEW OF SYSTEMS: Full 14 system review of systems performed and negative with exception of: Fatigue blurred vision shortness of breath memory loss confusion weakness slurred speech anxiety diarrhea hearing loss ringing in ears spinning sensation.   ALLERGIES: Allergies  Allergen Reactions  . Tylenol With Codeine #3 [Acetaminophen-Codeine] Rash    itching  . Vicodin [Hydrocodone-Acetaminophen] Rash    HOME MEDICATIONS: Outpatient Medications Prior to Visit  Medication Sig Dispense Refill  . acetaminophen-codeine (TYLENOL #3) 300-30 MG per tablet Take 1 tablet by mouth every 6 (six) hours as needed for moderate pain. 16 tablet 0  . amLODipine (NORVASC) 10 MG tablet Take 1 tablet (10 mg total) by mouth daily. 30 tablet 6  . aspirin 81 MG tablet Take 81 mg by mouth daily as needed.     Marland Kitchen atenolol-chlorthalidone (TENORETIC) 100-25 MG tablet  Take 1 tablet by mouth daily. Has not started this dose yet    . diclofenac sodium (VOLTAREN) 1 % GEL Apply 2 g topically 4 (four) times daily.    . Omega-3 Fatty Acids (FISH OIL) 1200 MG CAPS Take by mouth.    . valsartan (DIOVAN) 80 MG tablet Take 1 tablet (80 mg total) by mouth daily. 30 tablet 6  . VENTOLIN HFA 108 (90 Base) MCG/ACT inhaler Inhale 2 puffs into the lungs every 4 (four) hours as needed.    Marland Kitchen escitalopram (LEXAPRO) 5 MG tablet Take 5 mg by mouth daily.    . naproxen (NAPROSYN) 500 MG tablet Take 1,000 mg by mouth 2 (two) times daily as needed.     . traMADol (ULTRAM) 50 MG tablet Take 50 mg by mouth every 6 (six) hours as needed. pain    . atenolol-chlorthalidone (TENORETIC) 50-25 MG per tablet Take 1 tablet by mouth daily.     No facility-administered medications prior to visit.     PAST MEDICAL HISTORY: Past Medical History:  Diagnosis Date  . COPD (chronic obstructive pulmonary disease) (HCC)   . Hyperlipidemia   . Hypertension   . Vertigo   . Vitamin D deficiency     PAST SURGICAL HISTORY: Past Surgical History:  Procedure Laterality Date  . BREAST REDUCTION SURGERY  2000  . 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 Hypertension Mother   . Heart attack Father   . Hypertension Brother   . Arthritis Brother  rheumatoid  . Diabetes Sister   . Cancer Sister        breast  . Schizophrenia Son     SOCIAL HISTORY:  Social History   Social History  . Marital status: Married    Spouse name: N/A  . Number of children: N/A  . Years of education: N/A   Occupational History  . Not on file.   Social History Main Topics  . Smoking status: Light Tobacco Smoker    Types: Cigarettes    Start date: 04/16/1973  . Smokeless tobacco: Never Used     Comment: 3 pks every 3 days  . Alcohol use No  . Drug use: No  . Sexual activity: Not on file    Other Topics Concern  . Not on file   Social History Narrative   Lives at home. Retired.  Education 11th grade.  4 Children.  Caffeine decaf coffee 2 cups daily.     PHYSICAL EXAM  GENERAL EXAM/CONSTITUTIONAL: Vitals:  Vitals:   07/27/16 1018  BP: (!) 161/81  Pulse: (!) 52  Weight: 175 lb 6.4 oz (79.6 kg)  Height: 5\' 7"  (1.702 m)   Orthostatic VS for the past 24 hrs (Last 3 readings):  BP- Lying Pulse- Lying BP- Sitting Pulse- Sitting BP- Standing at 0 minutes Pulse- Standing at 0 minutes  07/27/16 1035 161/81 52 190/88 54 200/88 53       Body mass index is 27.47 kg/m.  No exam data present  Patient is in no distress; well developed, nourished and groomed; neck is supple  CARDIOVASCULAR:  Examination of carotid arteries is normal; LEFT > RIGHT CAROTID BRUIT  Regular rate and rhythm, no murmurs  Examination of peripheral vascular system by observation and palpation is normal  EYES:  Ophthalmoscopic exam of optic discs and posterior segments is normal; no papilledema or hemorrhages  MUSCULOSKELETAL:  Gait, strength, tone, movements noted in Neurologic exam below  NEUROLOGIC: MENTAL STATUS:  No flowsheet data found.  awake, alert, oriented to person, place and time  recent and remote memory intact  normal attention and concentration  language fluent, comprehension intact, naming intact,   fund of knowledge appropriate  CRANIAL NERVE:   2nd - no papilledema on fundoscopic exam  2nd, 3rd, 4th, 6th - pupils equal and reactive to light, visual fields full to confrontation, extraocular muscles intact, no nystagmus  5th - facial sensation symmetric  7th - facial strength symmetric  8th - hearing intact  9th - palate elevates symmetrically, uvula midline  11th - shoulder shrug symmetric  12th - tongue protrusion midline  MOTOR:   normal bulk and tone, full strength in the BUE, BLE  SENSORY:   normal and symmetric to light touch,  temperature, vibration  COORDINATION:   finger-nose-finger, fine finger movements normal  REFLEXES:   deep tendon reflexes present and symmetric  GAIT/STATION:   narrow based gait; able to walk tandem    DIAGNOSTIC DATA (LABS, IMAGING, TESTING) - I reviewed patient records, labs, notes, testing and imaging myself where available.  No results found for: WBC, HGB, HCT, MCV, PLT No results found for: NA, K, CL, CO2, GLUCOSE, BUN, CREATININE, CALCIUM, PROT, ALBUMIN, AST, ALT, ALKPHOS, BILITOT, GFRNONAA, GFRAA No results found for: CHOL, HDL, LDLCALC, LDLDIRECT, TRIG, CHOLHDL No results found for: ZOXW9U No results found for: VITAMINB12 No results found for: TSH      ASSESSMENT AND PLAN  66 y.o. year old female here with long history of intermittent vertigo symptoms for  past 20 years, now with new onset left arm and left leg numbness, slurred speech, confusion, vertigo and lightheadedness. Patient has uncontrolled hypertension. Will proceed with stroke evaluation.   Ddx: peripheral vestibulopathy, malignant hypertension, vertebro-basilar TIA  1. Vertebrobasilar artery insufficiency   2. Transient cerebral ischemia, unspecified type   3. Vertigo      PLAN:  VERTIGO/STROKE EVALUTION - check MRI brain, MRA head, MRA neck - continue aspirin 81mg  daily, fish oil, BP control meds - lipid and diabetes screening per PCP - smoking cessation reviewed; patient will follow up PCP  MALIGNANT HYPERTENSION - follow up with PCP and cardiology re: malignant HTN   Blood pressure (!) 161/81, pulse (!) 52, height 5\' 7"  (1.702 m), weight 175 lb 6.4 oz (79.6 kg). Orthostatic VS for the past 24 hrs (Last 3 readings):  BP- Lying Pulse- Lying BP- Sitting Pulse- Sitting BP- Standing at 0 minutes Pulse- Standing at 0 minutes  07/27/16 1035 161/81 52 190/88 54 200/88 53   Orders Placed This Encounter  Procedures  . MR BRAIN/IAC W WO CONTRAST  . MR MRA HEAD WO CONTRAST  . MR MRA NECK W  WO CONTRAST   Return in about 3 months (around 10/27/2016).  I reviewed images, labs, notes, records myself. I summarized findings and reviewed with patient, for this high risk condition (left sided numbness, slurred speech, vertigo) requiring high complexity decision making.     Suanne Marker, MD 07/27/2016, 10:40 AM Certified in Neurology, Neurophysiology and Neuroimaging  [facility] [address] [place], [place] [address] [phone]

## 2016-08-07 ENCOUNTER — Other Ambulatory Visit: Payer: Self-pay | Admitting: Diagnostic Neuroimaging

## 2016-08-10 ENCOUNTER — Ambulatory Visit
Admission: RE | Admit: 2016-08-10 | Discharge: 2016-08-10 | Disposition: A | Discharge status: Home / Self Care | Payer: Medicare HMO | Source: Ambulatory Visit | Attending: Diagnostic Neuroimaging | Admitting: Diagnostic Neuroimaging

## 2016-08-10 ENCOUNTER — Other Ambulatory Visit: Payer: Self-pay | Admitting: Diagnostic Neuroimaging

## 2016-08-10 DIAGNOSIS — G450 Vertebro-basilar artery syndrome: Secondary | ICD-10-CM

## 2016-08-10 DIAGNOSIS — R42 Dizziness and giddiness: Secondary | ICD-10-CM

## 2016-08-10 DIAGNOSIS — G459 Transient cerebral ischemic attack, unspecified: Secondary | ICD-10-CM

## 2016-08-16 ENCOUNTER — Telehealth: Payer: Self-pay | Admitting: *Deleted

## 2016-08-16 ENCOUNTER — Telehealth: Payer: Self-pay | Admitting: Diagnostic Neuroimaging

## 2016-08-16 NOTE — Telephone Encounter (Signed)
Spoke with patient and informed her that her MRI brain showed small spots of scar tissue, but no other major findings. Advised this may be due to her hypertension. Informed her that her MRI neck scan was unremarkable. Informed her that MRA brain showed narrow blood vessels in the brain.  Advised her Dr Marjory Lies recommends medical management of risk factors as in his last note. This RN reviewed all those risk factors and his recommendations with her.  Patient requested a copy; advised she come in, sign medical release and she may get a copy from medical records. Patient verbalized understanding, appreciation.

## 2016-08-16 NOTE — Telephone Encounter (Signed)
Pt would like a call as soon as the MRI Results become available

## 2016-08-30 ENCOUNTER — Other Ambulatory Visit: Payer: Self-pay | Admitting: Cardiovascular Disease

## 2016-09-11 ENCOUNTER — Ambulatory Visit (INDEPENDENT_AMBULATORY_CARE_PROVIDER_SITE_OTHER): Payer: Medicare HMO | Admitting: Cardiovascular Disease

## 2016-09-11 ENCOUNTER — Telehealth: Payer: Self-pay | Admitting: *Deleted

## 2016-09-11 ENCOUNTER — Encounter: Payer: Self-pay | Admitting: Cardiovascular Disease

## 2016-09-11 VITALS — BP 163/81 | HR 56 | Ht 67.0 in | Wt 176.0 lb

## 2016-09-11 DIAGNOSIS — N183 Chronic kidney disease, stage 3 unspecified: Secondary | ICD-10-CM

## 2016-09-11 DIAGNOSIS — F17210 Nicotine dependence, cigarettes, uncomplicated: Secondary | ICD-10-CM | POA: Diagnosis not present

## 2016-09-11 DIAGNOSIS — Z716 Tobacco abuse counseling: Secondary | ICD-10-CM | POA: Diagnosis not present

## 2016-09-11 DIAGNOSIS — I10 Essential (primary) hypertension: Secondary | ICD-10-CM | POA: Diagnosis not present

## 2016-09-11 DIAGNOSIS — R69 Illness, unspecified: Secondary | ICD-10-CM | POA: Diagnosis not present

## 2016-09-11 MED ORDER — VARENICLINE TARTRATE 0.5 MG X 11 & 1 MG X 42 PO MISC: ORAL | 0 refills | Status: DC

## 2016-09-11 NOTE — Telephone Encounter (Signed)
Received fax from [employer] - Chantix approved:  03/17/2016 - 03/13/2017.

## 2016-09-11 NOTE — Progress Notes (Signed)
SUBJECTIVE: The patient presents for follow-up of malignant hypertension.  She has vertigo and I referred her to neurology.  MRA of the head on 08/10/16 demonstrated severe stenosis at the origin of the anterior cerebral arteries and moderate stenosis within the M1 segments of the middle cerebral arteries. There was also stenosis near the origin of the left posterior cerebral artery.  Neurology recommended medical management.  She denies chest pain and shortness of breath.  Her pressure is elevated today, 163/81.  Labs on 06/29/16 showed chronic kidney disease, BUN 38, creatinine 2.13.   She requests help with tobacco cessation.     Review of Systems: As per "subjective", otherwise negative.  Allergies  Allergen Reactions  . Tylenol With Codeine #3 [Acetaminophen-Codeine] Rash    itching  . Vicodin [Hydrocodone-Acetaminophen] Rash    Current Outpatient Prescriptions  Medication Sig Dispense Refill  . aspirin 81 MG tablet Take 81 mg by mouth daily as needed.     Marland Kitchen atenolol-chlorthalidone (TENORETIC) 100-25 MG tablet Take 1 tablet by mouth daily. Has not started this dose yet    . diclofenac sodium (VOLTAREN) 1 % GEL Apply 2 g topically 4 (four) times daily.    Marland Kitchen escitalopram (LEXAPRO) 5 MG tablet Take 5 mg by mouth daily.    . magnesium oxide (MAG-OX) 400 MG tablet Take 400 mg by mouth daily.    . naproxen (NAPROSYN) 500 MG tablet Take 1,000 mg by mouth 2 (two) times daily as needed.     . Omega-3 Fatty Acids (FISH OIL) 1200 MG CAPS Take by mouth.    . valsartan (DIOVAN) 80 MG tablet TAKE ONE TABLET BY MOUTH ONCE DAILY 90 tablet 0  . VENTOLIN HFA 108 (90 Base) MCG/ACT inhaler Inhale 2 puffs into the lungs every 4 (four) hours as needed.     No current facility-administered medications for this visit.     Past Medical History:  Diagnosis Date  . COPD (chronic obstructive pulmonary disease) ([place])   . Hyperlipidemia   . Hypertension   . Vertigo   . Vitamin D  deficiency     Past Surgical History:  Procedure Laterality Date  . BREAST REDUCTION SURGERY  2000  . 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 Marital status: Married    Spouse name: N/A  . Number of children: N/A  . Years of education: N/A   Occupational History  . Not on file.   Social History Main Topics  . Smoking status: Light Tobacco Smoker    Packs/day: 0.25    Types: Cigarettes    Start date: 04/16/1973  . Smokeless tobacco: Never Used     Comment: 1 pk every 3 days  . Alcohol use No  . Drug use: No  . Sexual activity: Not on file   Other Topics Concern  . Not on file   Social History Narrative   Lives at home. Retired.  Education 11th grade.  4 Children.  Caffeine decaf coffee 2 cups daily.     Vitals:   09/11/16 1428  BP: (!) 163/81  Pulse: (!) 56  SpO2: 99%  Weight: 176 lb (79.8 kg)  Height: '5\' 7"'$  (1.702 m)    Wt Readings from Last 3 Encounters:  09/11/16 176 lb (79.8 kg)  07/27/16 175 lb 6.4 oz (79.6 kg)  03/15/16 185 lb (83.9 kg)     PHYSICAL  EXAM General: NAD HEENT: Normal. Neck: No JVD, no thyromegaly. Lungs: Clear to auscultation bilaterally with normal respiratory effort. CV: Nondisplaced PMI.  Regular rate and rhythm, normal S1/S2, no U3/J4, soft systolic murmur over RUSB. No pretibial or periankle edema.  No carotid bruit.   Abdomen: Soft, nontender, no distention.  Neurologic: Alert and oriented.  Psych: Normal affect. Skin: Normal. Musculoskeletal: No gross deformities.    ECG: Most recent ECG reviewed.   Labs: No results found for: K, BUN, CREATININE, ALT, TSH, HGB   Lipids: No results found for: LDLCALC, LDLDIRECT, CHOL, TRIG, HDL     ASSESSMENT AND PLAN: 1. Malignant HTN: Elevated. I will check a basic metabolic panel to evaluate renal function. Based on these results, I will adjust antihypertensive therapy. If creatinine remains elevated, I  will discontinue ARB and potentially chlorthalidone and switched to amlodipine.  2. Intracranial artery stenosis: Neurology has recommended medical management. This may be secondary to malignant hypertension. I will aim to control this. Continue aspirin.  3. Tobacco cessation: Counseling provided. I will prescribe a Chantix starter kit.   Disposition: Follow up 6 months  Kate Sable, M.D., F.A.C.C.

## 2016-09-11 NOTE — Patient Instructions (Addendum)
Medication Instructions:   Chantix starter kit.  Continue all other medications.    Labwork:  BMET - order given today.  Office will contact with results via phone or letter.    Testing/Procedures: none  Follow-Up: Your physician wants you to follow up in: 6 months.  You will receive a reminder letter in the mail one-two months in advance.  If you don't receive a letter, please call our office to schedule the follow up appointment   Any Other Special Instructions Will Be Listed Below (If Applicable).  If you need a refill on your cardiac medications before your next appointment, please call your pharmacy.

## 2016-09-12 DIAGNOSIS — N183 Chronic kidney disease, stage 3 (moderate): Secondary | ICD-10-CM | POA: Diagnosis not present

## 2016-09-12 DIAGNOSIS — I129 Hypertensive chronic kidney disease with stage 1 through stage 4 chronic kidney disease, or unspecified chronic kidney disease: Secondary | ICD-10-CM | POA: Diagnosis not present

## 2016-09-14 ENCOUNTER — Telehealth: Payer: Self-pay | Admitting: *Deleted

## 2016-09-14 MED ORDER — AMLODIPINE BESYLATE 5 MG PO TABS: 5.0000 mg | ORAL_TABLET | Freq: Every day | ORAL | 6 refills | Status: DC

## 2016-09-14 NOTE — Telephone Encounter (Signed)
Notes recorded by Lesle Chris, LPN on 5/63/8756 at 3:37 PM EDT Patient notified. She will stop the Valsartan & agrees to begin Amlodipine 5mg  daily. Copy to pmd. ------  Notes recorded by Lesle Chris, LPN on 4/33/2951 at 10:13 AM EDT Left message to return call.  ------  Notes recorded by Laqueta Linden, MD on 09/12/2016 at 4:07 PM EDT Creatinine has improved but remains elevated. DC valsartan and start amlodipine 5 mg.

## 2016-09-17 ENCOUNTER — Telehealth: Payer: Self-pay | Admitting: Cardiovascular Disease

## 2016-09-17 DIAGNOSIS — R3915 Urgency of urination: Secondary | ICD-10-CM

## 2016-09-17 DIAGNOSIS — N23 Unspecified renal colic: Secondary | ICD-10-CM

## 2016-09-17 NOTE — Telephone Encounter (Signed)
Samantha Bruce called stating that her kidneys are hurting. She thinks that some of her heart medications are causing her to hurt.

## 2016-09-17 NOTE — Telephone Encounter (Signed)
Patient c/o both sides of her kidneys nagging, aching.  Does not recall lifting or turning strange.  Has been bothering since this morning.  No c/o burning with urination.  Stated that she has been drinking extra water & cranberry juice in case could be UTI.  Valsartan was recently stopped & Amlodipine added.  Message fwd to provider for further advice.  In the meantime, if symptoms worsen ED / urgent care for evaluation.  Patient verbalized understanding.

## 2016-09-18 NOTE — Telephone Encounter (Signed)
Would check a urinalysis. Does not sound related to amlodipine. Consider seeing her PCP.

## 2016-09-18 NOTE — Telephone Encounter (Signed)
Patient notified.  Stated that she is feeling a little better today.  Does have OV to see PCP on 09/27/2016.  Stated that she is OOT & would be Friday or the following Monday before she could go.  Order faxed to [facility].

## 2016-10-04 ENCOUNTER — Encounter: Payer: Self-pay | Admitting: *Deleted

## 2016-10-04 DIAGNOSIS — I10 Essential (primary) hypertension: Secondary | ICD-10-CM | POA: Diagnosis not present

## 2016-10-04 DIAGNOSIS — M1711 Unilateral primary osteoarthritis, right knee: Secondary | ICD-10-CM | POA: Diagnosis not present

## 2016-10-04 DIAGNOSIS — R69 Illness, unspecified: Secondary | ICD-10-CM | POA: Diagnosis not present

## 2016-10-04 DIAGNOSIS — J4540 Moderate persistent asthma, uncomplicated: Secondary | ICD-10-CM | POA: Diagnosis not present

## 2016-10-04 DIAGNOSIS — E559 Vitamin D deficiency, unspecified: Secondary | ICD-10-CM | POA: Diagnosis not present

## 2016-10-04 DIAGNOSIS — E785 Hyperlipidemia, unspecified: Secondary | ICD-10-CM | POA: Diagnosis not present

## 2016-10-05 DIAGNOSIS — R3915 Urgency of urination: Secondary | ICD-10-CM | POA: Diagnosis not present

## 2016-10-05 DIAGNOSIS — N23 Unspecified renal colic: Secondary | ICD-10-CM | POA: Diagnosis not present

## 2016-10-18 ENCOUNTER — Telehealth: Payer: Self-pay | Admitting: *Deleted

## 2016-10-18 NOTE — Telephone Encounter (Signed)
Notes recorded by Lesle Chris, LPN on 04/19/6242 at 4:40 PM EDT Patient notified. Stated that she has always had blood in her urine. Advised that we will send copy to pmd & she should follow up with family doctor to see if any treatment is warranted. ------  Notes recorded by Lesle Chris, LPN on 08/26/5070 at 3:52 PM EDT Left message to return call.  ------  Notes recorded by Laqueta Linden, MD on 10/17/2016 at 10:20 AM EDT UA shows bacteria and blood. Should have follow up with PCP regarding this.

## 2016-10-19 DIAGNOSIS — Z79899 Other long term (current) drug therapy: Secondary | ICD-10-CM | POA: Diagnosis not present

## 2016-10-19 DIAGNOSIS — R69 Illness, unspecified: Secondary | ICD-10-CM | POA: Diagnosis not present

## 2016-10-19 DIAGNOSIS — Z7982 Long term (current) use of aspirin: Secondary | ICD-10-CM | POA: Diagnosis not present

## 2016-10-19 DIAGNOSIS — I10 Essential (primary) hypertension: Secondary | ICD-10-CM | POA: Diagnosis not present

## 2016-10-19 DIAGNOSIS — R2242 Localized swelling, mass and lump, left lower limb: Secondary | ICD-10-CM | POA: Diagnosis not present

## 2016-10-19 DIAGNOSIS — M7732 Calcaneal spur, left foot: Secondary | ICD-10-CM | POA: Diagnosis not present

## 2016-10-19 DIAGNOSIS — M79672 Pain in left foot: Secondary | ICD-10-CM | POA: Diagnosis not present

## 2016-10-20 DIAGNOSIS — M79662 Pain in left lower leg: Secondary | ICD-10-CM | POA: Diagnosis not present

## 2016-10-20 DIAGNOSIS — M79605 Pain in left leg: Secondary | ICD-10-CM | POA: Diagnosis not present

## 2016-11-05 DIAGNOSIS — G629 Polyneuropathy, unspecified: Secondary | ICD-10-CM | POA: Diagnosis not present

## 2016-11-05 DIAGNOSIS — M79675 Pain in left toe(s): Secondary | ICD-10-CM | POA: Diagnosis not present

## 2016-11-09 ENCOUNTER — Ambulatory Visit: Payer: Medicare HMO | Admitting: Diagnostic Neuroimaging

## 2016-11-21 DIAGNOSIS — Z6827 Body mass index (BMI) 27.0-27.9, adult: Secondary | ICD-10-CM | POA: Diagnosis not present

## 2016-11-21 DIAGNOSIS — I10 Essential (primary) hypertension: Secondary | ICD-10-CM | POA: Diagnosis not present

## 2016-11-21 DIAGNOSIS — Z23 Encounter for immunization: Secondary | ICD-10-CM | POA: Diagnosis not present

## 2016-11-21 DIAGNOSIS — N189 Chronic kidney disease, unspecified: Secondary | ICD-10-CM | POA: Diagnosis not present

## 2016-11-21 DIAGNOSIS — Z720 Tobacco use: Secondary | ICD-10-CM | POA: Diagnosis not present

## 2016-12-03 DIAGNOSIS — M79675 Pain in left toe(s): Secondary | ICD-10-CM | POA: Diagnosis not present

## 2016-12-03 DIAGNOSIS — G629 Polyneuropathy, unspecified: Secondary | ICD-10-CM | POA: Diagnosis not present

## 2016-12-13 DIAGNOSIS — Z1231 Encounter for screening mammogram for malignant neoplasm of breast: Secondary | ICD-10-CM | POA: Diagnosis not present

## 2017-01-08 DIAGNOSIS — Z23 Encounter for immunization: Secondary | ICD-10-CM | POA: Diagnosis not present

## 2017-03-06 ENCOUNTER — Telehealth: Payer: Self-pay | Admitting: *Deleted

## 2017-03-06 ENCOUNTER — Ambulatory Visit: Payer: Medicare HMO | Admitting: Diagnostic Neuroimaging

## 2017-03-06 NOTE — Telephone Encounter (Signed)
Patient was no show for follow up today. 

## 2017-03-07 ENCOUNTER — Encounter: Payer: Self-pay | Admitting: Diagnostic Neuroimaging

## 2017-03-14 ENCOUNTER — Encounter: Payer: Self-pay | Admitting: Cardiovascular Disease

## 2017-03-14 ENCOUNTER — Ambulatory Visit: Payer: Medicare HMO | Admitting: Cardiovascular Disease

## 2017-03-14 VITALS — BP 150/88 | HR 57 | Ht 66.0 in | Wt 180.0 lb

## 2017-03-14 DIAGNOSIS — I10 Essential (primary) hypertension: Secondary | ICD-10-CM

## 2017-03-14 MED ORDER — AMLODIPINE BESYLATE 10 MG PO TABS: 10.0000 mg | ORAL_TABLET | Freq: Every day | ORAL | 3 refills | Status: DC

## 2017-03-14 NOTE — Patient Instructions (Addendum)
Your physician wants you to follow-up in: 1 YEAR WITH DR KONESWARAN You will receive a reminder letter in the mail two months in advance. If you don't receive a letter, please call our office to schedule the follow-up appointment.  Your physician has recommended you make the following change in your medication:   INCREASE AMLODIPINE 10 MG DAILY   Thank you for choosing [facility]!!     

## 2017-03-14 NOTE — Progress Notes (Signed)
SUBJECTIVE: The patient presents for follow-up of malignant hypertension.  She has vertigo and I referred her to neurology.  MRA of the head on 08/10/16 demonstrated severe stenosis at the origin of the anterior cerebral arteries and moderate stenosis within the M1 segments of the middle cerebral arteries. There was also stenosis near the origin of the left posterior cerebral artery.  Neurology recommended medical management.  The patient denies any symptoms of chest pain, palpitations, shortness of breath, lightheadedness, dizziness, leg swelling, orthopnea, PND, and syncope.  She has been struggling with left foot and ankle issues and sees a podiatrist in [place].   Review of Systems: As per "subjective", otherwise negative.  Allergies  Allergen Reactions  . Tylenol With Codeine #3 [Acetaminophen-Codeine] Rash    itching  . Vicodin [Hydrocodone-Acetaminophen] Rash    Current Outpatient Medications  Medication Sig Dispense Refill  . amLODipine (NORVASC) 5 MG tablet Take 1 tablet (5 mg total) by mouth daily. 30 tablet 6  . aspirin 81 MG tablet Take 81 mg by mouth daily as needed.     Marland Kitchen atenolol-chlorthalidone (TENORETIC) 100-25 MG tablet Take 1 tablet by mouth daily. Has not started this dose yet    . diclofenac sodium (VOLTAREN) 1 % GEL Apply 2 g topically 4 (four) times daily.    . magnesium oxide (MAG-OX) 400 MG tablet Take 400 mg by mouth daily.    . Omega-3 Fatty Acids (FISH OIL) 1200 MG CAPS Take by mouth.    . VENTOLIN HFA 108 (90 Base) MCG/ACT inhaler Inhale 2 puffs into the lungs every 4 (four) hours as needed.     No current facility-administered medications for this visit.     Past Medical History:  Diagnosis Date  . COPD (chronic obstructive pulmonary disease) ([place])   . Hyperlipidemia   . Hypertension   . Vertigo   . Vitamin D deficiency     Past Surgical History:  Procedure Laterality Date  . BREAST REDUCTION SURGERY  2000  . 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 Marital status: Married    Spouse name: Not on file  . Number of children: Not on file  . Years of education: Not on file  . Highest education level: Not on file  Social Needs  . Financial resource strain: Not on file  . Food insecurity - worry: Not on file  . Food insecurity - inability: Not on file  . Transportation needs - medical: Not on file  . Transportation needs - non-medical: Not on file  Occupational History  . Not on file  Tobacco Use  . Smoking status: Light Tobacco Smoker    Packs/day: 0.25    Types: Cigarettes    Start date: 04/16/1973  . Smokeless tobacco: Never Used  . Tobacco comment: 1 pk every 3 days  Substance and Sexual Activity  . Alcohol use: No  . Drug use: No  . Sexual activity: Not on file  Other Topics Concern  . Not on file  Social History Narrative   Lives at home. Retired.  Education 11th grade.  4 Children.  Caffeine decaf coffee 2 cups daily.     Vitals:   03/14/17 1034  BP: (!) 150/88  Pulse: (!) 57  SpO2: 98%  Weight: 180 lb (81.6 kg)  Height: 5' 6" (1.676 m)    Wt Readings from Last 3 Encounters:  03/14/17 180 lb (81.6 kg)  09/11/16 176 lb (79.8 kg)  07/27/16 175 lb 6.4 oz (79.6 kg)     PHYSICAL EXAM General: NAD HEENT: Normal. Neck: No JVD, no thyromegaly. Lungs: Clear to auscultation bilaterally with normal respiratory effort. CV: Regular rate and rhythm, normal S1/S2, no G2/X5, soft systolic murmur over right upper sternal border. No pretibial or periankle edema.  No carotid bruit.   Abdomen: Soft, nontender, no distention.  Neurologic: Alert and oriented.  Psych: Normal affect. Skin: Normal. Musculoskeletal: No gross deformities.    ECG: Most recent ECG reviewed.   Labs: No results found for: K, BUN, CREATININE, ALT, TSH, HGB   Lipids: No results found for: LDLCALC, LDLDIRECT, CHOL, TRIG, HDL      ASSESSMENT AND PLAN:  1. Malignant HTN: Elevated. I will amlodipine to 10 mg daily.  2. Intracranial artery stenosis: Neurology has recommended medical management. This may be secondary to malignant hypertension. I will aim to control this. Continue aspirin.  3. Tobacco cessation: Counseling previously provided. I previously prescribed a Chantix starter kit.  She developed headaches with it and stop using it.  She said she smokes 2 cigarettes every few days.     Disposition: Follow up 1 year   Kate Sable, M.D., F.A.C.C.

## 2017-03-22 DIAGNOSIS — I10 Essential (primary) hypertension: Secondary | ICD-10-CM | POA: Diagnosis not present

## 2017-03-22 DIAGNOSIS — Z720 Tobacco use: Secondary | ICD-10-CM | POA: Diagnosis not present

## 2017-03-22 DIAGNOSIS — N189 Chronic kidney disease, unspecified: Secondary | ICD-10-CM | POA: Diagnosis not present

## 2017-03-28 DIAGNOSIS — Z720 Tobacco use: Secondary | ICD-10-CM | POA: Diagnosis not present

## 2017-03-28 DIAGNOSIS — I10 Essential (primary) hypertension: Secondary | ICD-10-CM | POA: Diagnosis not present

## 2017-03-28 DIAGNOSIS — N189 Chronic kidney disease, unspecified: Secondary | ICD-10-CM | POA: Diagnosis not present

## 2017-03-28 DIAGNOSIS — M79672 Pain in left foot: Secondary | ICD-10-CM | POA: Diagnosis not present

## 2017-03-28 DIAGNOSIS — Z6829 Body mass index (BMI) 29.0-29.9, adult: Secondary | ICD-10-CM | POA: Diagnosis not present

## 2017-04-19 DIAGNOSIS — R69 Illness, unspecified: Secondary | ICD-10-CM | POA: Diagnosis not present

## 2017-07-19 DIAGNOSIS — E669 Obesity, unspecified: Secondary | ICD-10-CM | POA: Diagnosis not present

## 2017-07-19 DIAGNOSIS — N189 Chronic kidney disease, unspecified: Secondary | ICD-10-CM | POA: Diagnosis not present

## 2017-07-19 DIAGNOSIS — E782 Mixed hyperlipidemia: Secondary | ICD-10-CM | POA: Diagnosis not present

## 2017-07-19 DIAGNOSIS — I10 Essential (primary) hypertension: Secondary | ICD-10-CM | POA: Diagnosis not present

## 2017-07-22 DIAGNOSIS — Z6829 Body mass index (BMI) 29.0-29.9, adult: Secondary | ICD-10-CM | POA: Diagnosis not present

## 2017-07-22 DIAGNOSIS — E782 Mixed hyperlipidemia: Secondary | ICD-10-CM | POA: Diagnosis not present

## 2017-07-22 DIAGNOSIS — Z720 Tobacco use: Secondary | ICD-10-CM | POA: Diagnosis not present

## 2017-07-22 DIAGNOSIS — N189 Chronic kidney disease, unspecified: Secondary | ICD-10-CM | POA: Diagnosis not present

## 2017-07-22 DIAGNOSIS — I10 Essential (primary) hypertension: Secondary | ICD-10-CM | POA: Diagnosis not present

## 2017-07-22 DIAGNOSIS — E876 Hypokalemia: Secondary | ICD-10-CM | POA: Diagnosis not present

## 2017-07-22 DIAGNOSIS — Z1389 Encounter for screening for other disorder: Secondary | ICD-10-CM | POA: Diagnosis not present

## 2017-07-22 DIAGNOSIS — Z1331 Encounter for screening for depression: Secondary | ICD-10-CM | POA: Diagnosis not present

## 2017-09-12 DIAGNOSIS — Z0100 Encounter for examination of eyes and vision without abnormal findings: Secondary | ICD-10-CM | POA: Diagnosis not present

## 2017-09-12 DIAGNOSIS — H5200 Hypermetropia, unspecified eye: Secondary | ICD-10-CM | POA: Diagnosis not present

## 2017-09-12 DIAGNOSIS — I10 Essential (primary) hypertension: Secondary | ICD-10-CM | POA: Diagnosis not present

## 2017-09-12 DIAGNOSIS — H2510 Age-related nuclear cataract, unspecified eye: Secondary | ICD-10-CM | POA: Diagnosis not present

## 2017-10-14 ENCOUNTER — Telehealth: Payer: Self-pay | Admitting: Cardiovascular Disease

## 2017-10-14 NOTE — Telephone Encounter (Signed)
Patient has been taking amlodipine 10 mg she did not realize that Is what she had been taking. Patient thought it was still 5 mg. Patients bottle reads 10 mg. Advised patient this was changed at office visit in December. Patient verbalized understanding.

## 2017-10-14 NOTE — Telephone Encounter (Signed)
Patient walk in  amLODipine (NORVASC) 10 MG tablet   Patient states she does not take 10mg   She has always taken 5mg 

## 2017-12-20 ENCOUNTER — Other Ambulatory Visit: Payer: Self-pay | Admitting: Cardiovascular Disease

## 2017-12-20 DIAGNOSIS — Z1231 Encounter for screening mammogram for malignant neoplasm of breast: Secondary | ICD-10-CM | POA: Diagnosis not present

## 2017-12-20 MED ORDER — ATENOLOL-CHLORTHALIDONE 100-25 MG PO TABS: 1.0000 | ORAL_TABLET | Freq: Every day | ORAL | 0 refills | Status: DC

## 2017-12-20 NOTE — Telephone Encounter (Signed)
° °  1. Which medications need to be refilled? (please list name of each medication and dose if known)   atenolol-chlorthalidone (TENORETIC) 100-25     2. Which pharmacy/location (including street and city if local pharmacy) is medication to be sent to?   [facility], [place]  3. Do they need a 30 day or 90 day supply? 90

## 2018-01-03 DIAGNOSIS — Z23 Encounter for immunization: Secondary | ICD-10-CM | POA: Diagnosis not present

## 2018-01-28 ENCOUNTER — Telehealth: Payer: Self-pay | Admitting: Cardiovascular Disease

## 2018-01-28 MED ORDER — AMLODIPINE BESYLATE 10 MG PO TABS: 5.0000 mg | ORAL_TABLET | Freq: Every day | ORAL | Status: DC

## 2018-01-28 NOTE — Telephone Encounter (Signed)
Thinks that this may be related to the Amlodipine.  No weight gain. No chest pain or sob.  Dizziness maybe once or twice.  By the evening time, has the swelling in feet and legs.  Does go down at night, but back up again by then end of the day.  1 year f/u scheduled for 03/18/2018.

## 2018-01-28 NOTE — Telephone Encounter (Signed)
Patient notified and verbalized understanding. 

## 2018-01-28 NOTE — Telephone Encounter (Signed)
Was she tolerating the 5 mg dose (ie no leg/ankle/feet swelling)? If so, she could try the lower dose and monitor BP's at home. If she cannot tolerate it at all, she will need an alternative form of antihypertensive therapy.

## 2018-01-28 NOTE — Telephone Encounter (Signed)
Patient called stating that she continues to have swelling in her feet and legs She thinks that her BP medications are causing this.  Please call [phone].

## 2018-03-18 ENCOUNTER — Ambulatory Visit: Payer: Medicare HMO | Admitting: Cardiovascular Disease

## 2018-03-18 ENCOUNTER — Encounter: Payer: Self-pay | Admitting: Cardiovascular Disease

## 2018-03-18 ENCOUNTER — Encounter: Payer: Self-pay | Admitting: *Deleted

## 2018-03-18 VITALS — BP 144/90 | HR 52 | Ht 67.0 in | Wt 177.0 lb

## 2018-03-18 DIAGNOSIS — I10 Essential (primary) hypertension: Secondary | ICD-10-CM

## 2018-03-18 DIAGNOSIS — I672 Cerebral atherosclerosis: Secondary | ICD-10-CM | POA: Diagnosis not present

## 2018-03-18 DIAGNOSIS — M25473 Effusion, unspecified ankle: Secondary | ICD-10-CM

## 2018-03-18 DIAGNOSIS — M25476 Effusion, unspecified foot: Secondary | ICD-10-CM

## 2018-03-18 DIAGNOSIS — Z720 Tobacco use: Secondary | ICD-10-CM

## 2018-03-18 DIAGNOSIS — N183 Chronic kidney disease, stage 3 unspecified: Secondary | ICD-10-CM

## 2018-03-18 DIAGNOSIS — M25471 Effusion, right ankle: Secondary | ICD-10-CM

## 2018-03-18 DIAGNOSIS — I669 Occlusion and stenosis of unspecified cerebral artery: Secondary | ICD-10-CM

## 2018-03-18 DIAGNOSIS — M25475 Effusion, left foot: Secondary | ICD-10-CM

## 2018-03-18 MED ORDER — HYDRALAZINE HCL 25 MG PO TABS: 25.0000 mg | ORAL_TABLET | Freq: Two times a day (BID) | ORAL | 3 refills | Status: DC

## 2018-03-18 NOTE — Patient Instructions (Signed)
Your physician wants you to follow-up in:  1 YEAR WITH DR Reggy EyeKONESWARAN You will receive a reminder letter in the mail two months in advance. If you don't receive a letter, please call our office to schedule the follow-up appointment.  Your physician has recommended you make the following change in your medication:   START HYDRALAZINE 25 MG TWICE DAILY   WE HAVE GIVEN YOU A NOTE FOR JURY DUTY   Thank you for choosing [facility]!!

## 2018-03-18 NOTE — Progress Notes (Signed)
SUBJECTIVE: The patient presents for follow-up of malignant hypertension.  She has vertigo and I referred her to neurology.  MRA of the head on 08/10/16 demonstrated severe stenosisat the origin of the anterior cerebral arteries and moderate stenosis within the M1 segments of the middle cerebral arteries.There was also stenosis near the origin of the left posterior cerebral artery.  Neurology recommended medical management.  She has had ankle and feet swelling by the end of day ever since starting amlodipine.  Reducing the dose to 5 mg did not alleviate her symptoms.  She denies chest pain, palpitations, orthopnea, and shortness of breath.  She does stay on her feet a lot as she helps to take care of her granddaughter.  ECG performed in the office today which I ordered and personally interpreted demonstrates sinus bradycardia with nonspecific T wave abnormalities.    Review of Systems: As per "subjective", otherwise negative.  Allergies  Allergen Reactions  . Tylenol With Codeine #3 [Acetaminophen-Codeine] Rash    itching  . Vicodin [Hydrocodone-Acetaminophen] Rash    Current Outpatient Medications  Medication Sig Dispense Refill  . amLODipine (NORVASC) 10 MG tablet Take 0.5 tablets (5 mg total) by mouth daily.    Marland Kitchen atenolol-chlorthalidone (TENORETIC) 100-25 MG tablet Take 1 tablet by mouth daily. Has not started this dose yet 90 tablet 0  . diclofenac sodium (VOLTAREN) 1 % GEL Apply 2 g topically 4 (four) times daily.    . magnesium oxide (MAG-OX) 400 MG tablet Take 400 mg by mouth daily.    . Omega-3 Fatty Acids (FISH OIL) 1200 MG CAPS Take by mouth.    . VENTOLIN HFA 108 (90 Base) MCG/ACT inhaler Inhale 2 puffs into the lungs every 4 (four) hours as needed.     No current facility-administered medications for this visit.     Past Medical History:  Diagnosis Date  . COPD (chronic obstructive pulmonary disease) ([place])   . Hyperlipidemia   . Hypertension   . Vertigo    . Vitamin D deficiency     Past Surgical History:  Procedure Laterality Date  . BREAST REDUCTION SURGERY  2000  . 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 Marital status: Married    Spouse name: Not on file  . Number of children: Not on file  . Years of education: Not on file  . Highest education level: Not on file  Occupational History  . Not on file  Social Needs  . Financial resource strain: Not on file  . Food insecurity:    Worry: Not on file    Inability: Not on file  . Transportation needs:    Medical: Not on file    Non-medical: Not on file  Tobacco Use  . Smoking status: Light Tobacco Smoker    Packs/day: 0.25    Types: Cigarettes    Start date: 04/16/1973  . Smokeless tobacco: Never Used  . Tobacco comment: 1 pk every 3 days  Substance and Sexual Activity  . Alcohol use: No  . Drug use: No  . Sexual activity: Not on file  Lifestyle  . Physical activity:    Days per week: Not on file    Minutes per session: Not on file  . Stress: Not on file  Relationships  . Social connections:    Talks on phone: Not on file    Gets together: Not  on file    Attends religious service: Not on file    Active member of club or organization: Not on file    Attends meetings of clubs or organizations: Not on file    Relationship status: Not on file  . Intimate partner violence:    Fear of current or ex partner: Not on file    Emotionally abused: Not on file    Physically abused: Not on file    Forced sexual activity: Not on file  Other Topics Concern  . Not on file  Social History Narrative   Lives at home. Retired.  Education 11th grade.  4 Children.  Caffeine decaf coffee 2 cups daily.     Vitals:   03/18/18 0904  BP: (!) 144/90  Pulse: (!) 52  SpO2: 94%  Weight: 177 lb (80.3 kg)  Height: '5\' 7"'$  (1.702 m)    Wt Readings from Last 3 Encounters:  03/18/18 177 lb (80.3 kg)    03/14/17 180 lb (81.6 kg)  09/11/16 176 lb (79.8 kg)     PHYSICAL EXAM General: NAD HEENT: Normal. Neck: No JVD, no thyromegaly. Lungs: Clear to auscultation bilaterally with normal respiratory effort. CV: Regular rate and rhythm, normal S1/S2, no O5/D6, soft systolic murmur over right upper sternal border.  Trace bilateral periankle edema.  No carotid bruit.   Abdomen: Soft, nontender, no distention.  Neurologic: Alert and oriented.  Psych: Normal affect. Skin: Normal. Musculoskeletal: No gross deformities.    ECG: Reviewed above under Subjective   Labs: No results found for: K, BUN, CREATININE, ALT, TSH, HGB   Lipids: No results found for: LDLCALC, LDLDIRECT, CHOL, TRIG, HDL     ASSESSMENT AND PLAN:  1. Malignant UYQ:IHKVQ pressure is elevated.  She has developed ankle and feet swelling with amlodipine.  I will discontinue this and start hydralazine 25 mg twice daily.  2. Intracranial artery stenosis: Neurology has recommended medical management. T  3. Tobacco use: Counseling previously provided. I previously prescribed a Chantix starter kit.  She developed headaches with it and stop using it.    4.  Chronic kidney disease stage III: I will obtain a copy of labs from PCP.   Disposition: Follow up 1 year   Kate Sable, M.D., F.A.C.C.

## 2018-03-24 ENCOUNTER — Other Ambulatory Visit: Payer: Self-pay | Admitting: Cardiovascular Disease

## 2018-03-24 MED ORDER — ATENOLOL-CHLORTHALIDONE 100-25 MG PO TABS: 1.0000 | ORAL_TABLET | Freq: Every day | ORAL | 3 refills | Status: DC

## 2018-03-24 NOTE — Telephone Encounter (Signed)
[facility] called stating that [facility] not received [facility] on medications.

## 2018-03-24 NOTE — Telephone Encounter (Signed)
Done

## 2018-03-24 NOTE — Telephone Encounter (Signed)
Patient called [facility] office back in regards to refill on atenolol-chlorthalidone (TENORETIC) 100 [facility]  [place], [place]

## 2018-05-23 DIAGNOSIS — I10 Essential (primary) hypertension: Secondary | ICD-10-CM | POA: Diagnosis not present

## 2018-05-23 DIAGNOSIS — R1032 Left lower quadrant pain: Secondary | ICD-10-CM | POA: Diagnosis not present

## 2018-05-23 DIAGNOSIS — M79672 Pain in left foot: Secondary | ICD-10-CM | POA: Diagnosis not present

## 2018-05-23 DIAGNOSIS — N183 Chronic kidney disease, stage 3 (moderate): Secondary | ICD-10-CM | POA: Diagnosis not present

## 2018-05-23 DIAGNOSIS — Z6827 Body mass index (BMI) 27.0-27.9, adult: Secondary | ICD-10-CM | POA: Diagnosis not present

## 2018-05-23 DIAGNOSIS — E782 Mixed hyperlipidemia: Secondary | ICD-10-CM | POA: Diagnosis not present

## 2018-05-23 DIAGNOSIS — Z720 Tobacco use: Secondary | ICD-10-CM | POA: Diagnosis not present

## 2018-05-27 ENCOUNTER — Other Ambulatory Visit: Payer: Self-pay | Admitting: Physician Assistant

## 2018-05-27 DIAGNOSIS — R1032 Left lower quadrant pain: Secondary | ICD-10-CM

## 2018-05-29 ENCOUNTER — Other Ambulatory Visit: Payer: Medicare HMO

## 2018-06-04 ENCOUNTER — Ambulatory Visit
Admission: RE | Admit: 2018-06-04 | Discharge: 2018-06-04 | Disposition: A | Discharge status: Home / Self Care | Payer: Medicare HMO | Source: Ambulatory Visit | Attending: Physician Assistant | Admitting: Physician Assistant

## 2018-06-04 DIAGNOSIS — R1032 Left lower quadrant pain: Secondary | ICD-10-CM

## 2018-06-04 DIAGNOSIS — K8020 Calculus of gallbladder without cholecystitis without obstruction: Secondary | ICD-10-CM | POA: Diagnosis not present

## 2018-06-18 DIAGNOSIS — R1084 Generalized abdominal pain: Secondary | ICD-10-CM | POA: Diagnosis not present

## 2018-06-18 DIAGNOSIS — K8020 Calculus of gallbladder without cholecystitis without obstruction: Secondary | ICD-10-CM | POA: Diagnosis not present

## 2018-06-23 ENCOUNTER — Other Ambulatory Visit: Payer: Self-pay | Admitting: General Surgery

## 2018-06-23 DIAGNOSIS — R109 Unspecified abdominal pain: Secondary | ICD-10-CM

## 2018-06-27 ENCOUNTER — Other Ambulatory Visit: Payer: Self-pay

## 2018-06-27 ENCOUNTER — Ambulatory Visit
Admission: RE | Admit: 2018-06-27 | Discharge: 2018-06-27 | Disposition: A | Discharge status: Home / Self Care | Payer: Medicare HMO | Source: Ambulatory Visit | Attending: General Surgery | Admitting: General Surgery

## 2018-06-27 DIAGNOSIS — R109 Unspecified abdominal pain: Secondary | ICD-10-CM

## 2018-06-27 DIAGNOSIS — K8020 Calculus of gallbladder without cholecystitis without obstruction: Secondary | ICD-10-CM | POA: Diagnosis not present

## 2018-08-21 DIAGNOSIS — I10 Essential (primary) hypertension: Secondary | ICD-10-CM | POA: Diagnosis not present

## 2018-08-21 DIAGNOSIS — J449 Chronic obstructive pulmonary disease, unspecified: Secondary | ICD-10-CM | POA: Diagnosis not present

## 2018-08-21 DIAGNOSIS — R69 Illness, unspecified: Secondary | ICD-10-CM | POA: Diagnosis not present

## 2018-08-21 DIAGNOSIS — R109 Unspecified abdominal pain: Secondary | ICD-10-CM | POA: Diagnosis not present

## 2018-09-26 DIAGNOSIS — Z6827 Body mass index (BMI) 27.0-27.9, adult: Secondary | ICD-10-CM | POA: Diagnosis not present

## 2018-09-26 DIAGNOSIS — N183 Chronic kidney disease, stage 3 (moderate): Secondary | ICD-10-CM | POA: Diagnosis not present

## 2018-09-26 DIAGNOSIS — I10 Essential (primary) hypertension: Secondary | ICD-10-CM | POA: Diagnosis not present

## 2018-09-26 DIAGNOSIS — E782 Mixed hyperlipidemia: Secondary | ICD-10-CM | POA: Diagnosis not present

## 2018-09-26 DIAGNOSIS — M79672 Pain in left foot: Secondary | ICD-10-CM | POA: Diagnosis not present

## 2018-09-26 DIAGNOSIS — Z720 Tobacco use: Secondary | ICD-10-CM | POA: Diagnosis not present

## 2018-09-26 DIAGNOSIS — Z1331 Encounter for screening for depression: Secondary | ICD-10-CM | POA: Diagnosis not present

## 2019-01-03 DIAGNOSIS — R69 Illness, unspecified: Secondary | ICD-10-CM | POA: Diagnosis not present

## 2019-01-14 DIAGNOSIS — H524 Presbyopia: Secondary | ICD-10-CM | POA: Diagnosis not present

## 2019-03-02 ENCOUNTER — Other Ambulatory Visit: Payer: Self-pay | Admitting: Cardiovascular Disease

## 2019-03-02 MED ORDER — ATENOLOL-CHLORTHALIDONE 100-25 MG PO TABS: 1.0000 | ORAL_TABLET | Freq: Every day | ORAL | 1 refills | Status: DC

## 2019-03-02 MED ORDER — HYDRALAZINE HCL 25 MG PO TABS: 25.0000 mg | ORAL_TABLET | Freq: Two times a day (BID) | ORAL | 1 refills | Status: DC

## 2019-03-02 NOTE — Telephone Encounter (Signed)
    1. Which medications need to be refilled? (please list name of each medication and dose if known)  Hydralazine 25 mg  Tenorectic 100-25  2. Which pharmacy/location (including street and city if local pharmacy) is medication to be sent to ? [facility]  [place], [place]   3. Do they need a 30 day or 90 day supply?    Patient is switching pharmacies.

## 2019-03-02 NOTE — Telephone Encounter (Signed)
Medication sent to pharmacy  

## 2019-03-27 DIAGNOSIS — Z1231 Encounter for screening mammogram for malignant neoplasm of breast: Secondary | ICD-10-CM | POA: Diagnosis not present

## 2019-03-31 ENCOUNTER — Telehealth (INDEPENDENT_AMBULATORY_CARE_PROVIDER_SITE_OTHER): Payer: Medicare HMO | Admitting: Cardiovascular Disease

## 2019-03-31 ENCOUNTER — Encounter: Payer: Self-pay | Admitting: Cardiovascular Disease

## 2019-03-31 VITALS — Ht 67.0 in | Wt 170.0 lb

## 2019-03-31 DIAGNOSIS — I672 Cerebral atherosclerosis: Secondary | ICD-10-CM

## 2019-03-31 DIAGNOSIS — I669 Occlusion and stenosis of unspecified cerebral artery: Secondary | ICD-10-CM

## 2019-03-31 DIAGNOSIS — Z720 Tobacco use: Secondary | ICD-10-CM

## 2019-03-31 DIAGNOSIS — I10 Essential (primary) hypertension: Secondary | ICD-10-CM

## 2019-03-31 DIAGNOSIS — N183 Chronic kidney disease, stage 3 unspecified: Secondary | ICD-10-CM

## 2019-03-31 NOTE — Progress Notes (Signed)
Virtual Visit via Telephone Note   This visit type was conducted due to national recommendations for restrictions regarding the COVID-19 Pandemic (e.g. social distancing) in an effort to limit this patient's exposure and mitigate transmission in our community.  Due to her co-morbid illnesses, this patient is at least at moderate risk for complications without adequate follow up.  This format is felt to be most appropriate for this patient at this time.  The patient did not have access to video technology/had technical difficulties with video requiring transitioning to audio format only (telephone).  All issues noted in this document were discussed and addressed.  No physical exam could be performed with this format.  Please refer to the patient's chart for her  consent to telehealth for [facility].   Date:  03/31/2019   ID:  Samantha Bruce, DOB 1950-12-22, MRN [id number]  Patient Location: Home Provider Location: Office  PCP:  Rosalee Kaufman, PA-C  Cardiologist:  Kate Sable, MD  Electrophysiologist:  None   Evaluation Performed:  Follow-Up Visit  Chief Complaint: Malignant hypertension  History of Present Illness:    Samantha Bruce is a 69 y.o. female with malignant hypertension and vertigo.  MRA of the head on 08/10/16 demonstrated severe stenosisat the origin of the anterior cerebral arteries and moderate stenosis within the M1 segments of the middle cerebral arteries.There was also stenosis near the origin of the left posterior cerebral artery.  Neurology recommended medical management.  The patient denies any symptoms of chest pain, palpitations, shortness of breath, lightheadedness, dizziness, leg swelling, orthopnea, PND, and syncope.  She checks her BP periodically.   Past Medical History:  Diagnosis Date  . COPD (chronic obstructive pulmonary disease) ([place])   . Hyperlipidemia   . Hypertension   . Vertigo   . Vitamin D deficiency    Past  Surgical History:  Procedure Laterality Date  . BREAST REDUCTION SURGERY  2000  . 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 acetaminophen (TYLENOL) 325 MG tablet Take 650 mg by mouth every 6 (six) hours as needed.  Marland Kitchen atenolol-chlorthalidone (TENORETIC) 100-25 MG tablet Take 1 tablet by mouth daily. Has not started this dose yet  . diclofenac sodium (VOLTAREN) 1 % GEL Apply 2 g topically 4 (four) times daily.  . hydrALAZINE (APRESOLINE) 25 MG tablet Take 1 tablet (25 mg total) by mouth 2 (two) times daily.  . magnesium oxide (MAG-OX) 400 MG tablet Take 400 mg by mouth daily.  . Omega-3 Fatty Acids (FISH OIL) 1200 MG CAPS Take by mouth.     Allergies:   Tylenol with codeine #3 [acetaminophen-codeine] and Vicodin [hydrocodone-acetaminophen]   Social History   Tobacco Use  . Smoking status: Light Tobacco Smoker    Packs/day: 0.25    Types: Cigarettes    Start date: 04/16/1973  . Smokeless tobacco: Never Used  . Tobacco comment: 1 pk every 3 days  Substance Use Topics  . Alcohol use: No  . Drug use: No     Family Hx: The patient's family history includes Arthritis in her brother; Cancer in her sister; Diabetes in her sister; Heart attack in her father; Hypertension in her brother and mother; Schizophrenia in her son.  ROS:   Please see the history of present illness.     All other systems reviewed and are negative.   Prior CV studies:   The following  studies were reviewed today:  NA  Labs/Other Tests and Data Reviewed:    EKG:  No ECG reviewed.  Recent Labs: No results found for requested labs within last 8760 hours.   Recent Lipid Panel No results found for: CHOL, TRIG, HDL, CHOLHDL, LDLCALC, LDLDIRECT  Wt Readings from Last 3 Encounters:  03/31/19 170 lb (77.1 kg)  03/18/18 177 lb (80.3 kg)  03/14/17 180 lb (81.6 kg)     Objective:    Vital Signs:  Ht '5\' 7"'$  (1.702 m)   Wt 170 lb (77.1 kg)    BMI 26.63 kg/m    VITAL SIGNS:  reviewed  ASSESSMENT & PLAN:    1. Malignant HTN: Continue atenolol, chlorthalidone, and hydralazine.  She developed ankle and feet swelling with amlodipine in the past.  2. Intracranial artery stenosis: Neurology has recommended medical management.   3. Tobacco use: Counselingpreviouslyprovided. Ipreviously prescribeda Chantix starter kit.She developed headaches with it and stoped using it.   4.  Chronic kidney disease stage III: Creatinine 1.35 in March 2020.    COVID-19 Education: The signs and symptoms of COVID-19 were discussed with the patient and how to seek care for testing (follow up with PCP or arrange E-visit).  The importance of social distancing was discussed today.  Time:   Today, I have spent 5 minutes with the patient with telehealth technology discussing the above problems.     Medication Adjustments/Labs and Tests Ordered: Current medicines are reviewed at length with the patient today.  Concerns regarding medicines are outlined above.   Tests Ordered: No orders of the defined types were placed in this encounter.   Medication Changes: No orders of the defined types were placed in this encounter.   Follow Up:  Virtual Visit  in 1 year(s)  Signed, Kate Sable, MD  03/31/2019 9:22 AM    [facility]

## 2019-03-31 NOTE — Patient Instructions (Signed)
Your physician wants you to follow-up in: 1 YEAR WITH DR KONESWARAN TELEHEALTH You will receive a reminder letter in the mail two months in advance. If you don't receive a letter, please call our office to schedule the follow-up appointment.  Your physician recommends that you continue on your current medications as directed. Please refer to the Current Medication list given to you today.  Thank you for choosing [facility]!!    

## 2019-04-17 DIAGNOSIS — I10 Essential (primary) hypertension: Secondary | ICD-10-CM | POA: Diagnosis not present

## 2019-04-17 DIAGNOSIS — Z720 Tobacco use: Secondary | ICD-10-CM | POA: Diagnosis not present

## 2019-04-17 DIAGNOSIS — E876 Hypokalemia: Secondary | ICD-10-CM | POA: Diagnosis not present

## 2019-04-17 DIAGNOSIS — N183 Chronic kidney disease, stage 3 unspecified: Secondary | ICD-10-CM | POA: Diagnosis not present

## 2019-04-17 DIAGNOSIS — E782 Mixed hyperlipidemia: Secondary | ICD-10-CM | POA: Diagnosis not present

## 2019-04-24 DIAGNOSIS — Z0000 Encounter for general adult medical examination without abnormal findings: Secondary | ICD-10-CM | POA: Diagnosis not present

## 2019-04-24 DIAGNOSIS — E782 Mixed hyperlipidemia: Secondary | ICD-10-CM | POA: Diagnosis not present

## 2019-04-24 DIAGNOSIS — Z720 Tobacco use: Secondary | ICD-10-CM | POA: Diagnosis not present

## 2019-04-24 DIAGNOSIS — I10 Essential (primary) hypertension: Secondary | ICD-10-CM | POA: Diagnosis not present

## 2019-04-24 DIAGNOSIS — Z6827 Body mass index (BMI) 27.0-27.9, adult: Secondary | ICD-10-CM | POA: Diagnosis not present

## 2019-06-17 DIAGNOSIS — I10 Essential (primary) hypertension: Secondary | ICD-10-CM | POA: Diagnosis not present

## 2019-06-17 DIAGNOSIS — E7849 Other hyperlipidemia: Secondary | ICD-10-CM | POA: Diagnosis not present

## 2019-07-17 DIAGNOSIS — E7849 Other hyperlipidemia: Secondary | ICD-10-CM | POA: Diagnosis not present

## 2019-07-17 DIAGNOSIS — I10 Essential (primary) hypertension: Secondary | ICD-10-CM | POA: Diagnosis not present

## 2019-09-03 ENCOUNTER — Other Ambulatory Visit: Payer: Self-pay | Admitting: Cardiovascular Disease

## 2019-09-16 DIAGNOSIS — N183 Chronic kidney disease, stage 3 unspecified: Secondary | ICD-10-CM | POA: Diagnosis not present

## 2019-09-16 DIAGNOSIS — E7849 Other hyperlipidemia: Secondary | ICD-10-CM | POA: Diagnosis not present

## 2019-09-16 DIAGNOSIS — I129 Hypertensive chronic kidney disease with stage 1 through stage 4 chronic kidney disease, or unspecified chronic kidney disease: Secondary | ICD-10-CM | POA: Diagnosis not present

## 2019-09-16 DIAGNOSIS — E876 Hypokalemia: Secondary | ICD-10-CM | POA: Diagnosis not present

## 2019-09-26 DIAGNOSIS — M10071 Idiopathic gout, right ankle and foot: Secondary | ICD-10-CM | POA: Diagnosis not present

## 2019-09-26 DIAGNOSIS — M79674 Pain in right toe(s): Secondary | ICD-10-CM | POA: Diagnosis not present

## 2019-10-16 DIAGNOSIS — E7849 Other hyperlipidemia: Secondary | ICD-10-CM | POA: Diagnosis not present

## 2019-10-16 DIAGNOSIS — I129 Hypertensive chronic kidney disease with stage 1 through stage 4 chronic kidney disease, or unspecified chronic kidney disease: Secondary | ICD-10-CM | POA: Diagnosis not present

## 2019-10-16 DIAGNOSIS — N183 Chronic kidney disease, stage 3 unspecified: Secondary | ICD-10-CM | POA: Diagnosis not present

## 2019-10-16 DIAGNOSIS — E876 Hypokalemia: Secondary | ICD-10-CM | POA: Diagnosis not present

## 2019-10-18 DIAGNOSIS — R220 Localized swelling, mass and lump, head: Secondary | ICD-10-CM | POA: Diagnosis not present

## 2019-10-23 DIAGNOSIS — N183 Chronic kidney disease, stage 3 unspecified: Secondary | ICD-10-CM | POA: Diagnosis not present

## 2019-10-23 DIAGNOSIS — I10 Essential (primary) hypertension: Secondary | ICD-10-CM | POA: Diagnosis not present

## 2019-10-23 DIAGNOSIS — R5383 Other fatigue: Secondary | ICD-10-CM | POA: Diagnosis not present

## 2019-10-23 DIAGNOSIS — R42 Dizziness and giddiness: Secondary | ICD-10-CM | POA: Diagnosis not present

## 2019-10-23 DIAGNOSIS — Z720 Tobacco use: Secondary | ICD-10-CM | POA: Diagnosis not present

## 2019-10-23 DIAGNOSIS — E782 Mixed hyperlipidemia: Secondary | ICD-10-CM | POA: Diagnosis not present

## 2019-10-23 DIAGNOSIS — Z7689 Persons encountering health services in other specified circumstances: Secondary | ICD-10-CM | POA: Diagnosis not present

## 2019-10-23 DIAGNOSIS — Z6824 Body mass index (BMI) 24.0-24.9, adult: Secondary | ICD-10-CM | POA: Diagnosis not present

## 2019-10-23 DIAGNOSIS — M109 Gout, unspecified: Secondary | ICD-10-CM | POA: Diagnosis not present

## 2019-11-13 DIAGNOSIS — N183 Chronic kidney disease, stage 3 unspecified: Secondary | ICD-10-CM | POA: Diagnosis not present

## 2019-11-13 DIAGNOSIS — R42 Dizziness and giddiness: Secondary | ICD-10-CM | POA: Diagnosis not present

## 2019-11-13 DIAGNOSIS — Z6825 Body mass index (BMI) 25.0-25.9, adult: Secondary | ICD-10-CM | POA: Diagnosis not present

## 2019-11-13 DIAGNOSIS — M109 Gout, unspecified: Secondary | ICD-10-CM | POA: Diagnosis not present

## 2019-11-13 DIAGNOSIS — R5383 Other fatigue: Secondary | ICD-10-CM | POA: Diagnosis not present

## 2019-11-13 DIAGNOSIS — Z7689 Persons encountering health services in other specified circumstances: Secondary | ICD-10-CM | POA: Diagnosis not present

## 2019-11-13 DIAGNOSIS — E782 Mixed hyperlipidemia: Secondary | ICD-10-CM | POA: Diagnosis not present

## 2019-11-13 DIAGNOSIS — I10 Essential (primary) hypertension: Secondary | ICD-10-CM | POA: Diagnosis not present

## 2019-11-13 DIAGNOSIS — Z720 Tobacco use: Secondary | ICD-10-CM | POA: Diagnosis not present

## 2019-12-07 ENCOUNTER — Other Ambulatory Visit: Payer: Self-pay | Admitting: *Deleted

## 2019-12-07 MED ORDER — HYDRALAZINE HCL 25 MG PO TABS: 25.0000 mg | ORAL_TABLET | Freq: Two times a day (BID) | ORAL | 1 refills | Status: DC

## 2019-12-11 DIAGNOSIS — Z7189 Other specified counseling: Secondary | ICD-10-CM | POA: Diagnosis not present

## 2019-12-11 DIAGNOSIS — R42 Dizziness and giddiness: Secondary | ICD-10-CM | POA: Diagnosis not present

## 2019-12-11 DIAGNOSIS — Z720 Tobacco use: Secondary | ICD-10-CM | POA: Diagnosis not present

## 2019-12-11 DIAGNOSIS — I10 Essential (primary) hypertension: Secondary | ICD-10-CM | POA: Diagnosis not present

## 2019-12-11 DIAGNOSIS — M109 Gout, unspecified: Secondary | ICD-10-CM | POA: Diagnosis not present

## 2019-12-11 DIAGNOSIS — Z6825 Body mass index (BMI) 25.0-25.9, adult: Secondary | ICD-10-CM | POA: Diagnosis not present

## 2019-12-11 DIAGNOSIS — Z7689 Persons encountering health services in other specified circumstances: Secondary | ICD-10-CM | POA: Diagnosis not present

## 2020-01-15 DIAGNOSIS — I10 Essential (primary) hypertension: Secondary | ICD-10-CM | POA: Diagnosis not present

## 2020-01-19 DIAGNOSIS — H43392 Other vitreous opacities, left eye: Secondary | ICD-10-CM | POA: Diagnosis not present

## 2020-01-22 DIAGNOSIS — Z6825 Body mass index (BMI) 25.0-25.9, adult: Secondary | ICD-10-CM | POA: Diagnosis not present

## 2020-01-22 DIAGNOSIS — I10 Essential (primary) hypertension: Secondary | ICD-10-CM | POA: Diagnosis not present

## 2020-04-01 DIAGNOSIS — I10 Essential (primary) hypertension: Secondary | ICD-10-CM | POA: Diagnosis not present

## 2020-04-08 ENCOUNTER — Ambulatory Visit: Payer: Medicare HMO | Admitting: Cardiology

## 2020-04-08 ENCOUNTER — Encounter: Payer: Self-pay | Admitting: Cardiology

## 2020-04-08 NOTE — Progress Notes (Deleted)
Clinical Summary Ms. Cdebaca is a 70 y.o.female last seen by Dr Purvis Sheffield, this is is our first visit together.  1. HTN - leg swelling on norvasc in the past  2. Cerebral vascular disease - MRA of the head on 08/10/16 demonstrated severe stenosisat the origin of the anterior cerebral arteries and moderate stenosis within the M1 segments of the middle cerebral arteries.There was also stenosis near the origin of the left posterior cerebral artery. - Neurology recommended medical management  3. Vertigo  4. CKD III Past Medical History:  Diagnosis Date  . COPD (chronic obstructive pulmonary disease) ([facility])   . Hyperlipidemia   . Hypertension   . Vertigo   . Vitamin D deficiency      Allergies  Allergen Reactions  . Tylenol With Codeine #3 [Acetaminophen-Codeine] Rash    itching  . Vicodin [Hydrocodone-Acetaminophen] Rash     Current Outpatient Medications  Medication Sig Dispense Refill  . acetaminophen (TYLENOL) 325 MG tablet Take 650 mg by mouth every 6 (six) hours as needed.    Marland Kitchen atenolol-chlorthalidone (TENORETIC) 100-25 MG tablet TAKE 1 TABLET BY MOUTH DAILY. HAS NOT STARTED THIS DOSE YET 90 tablet 1  . diclofenac sodium (VOLTAREN) 1 % GEL Apply 2 g topically 4 (four) times daily.    . hydrALAZINE (APRESOLINE) 25 MG tablet Take 1 tablet (25 mg total) by mouth 2 (two) times daily. 180 tablet 1  . magnesium oxide (MAG-OX) 400 MG tablet Take 400 mg by mouth daily.    . Omega-3 Fatty Acids (FISH OIL) 1200 MG CAPS Take by mouth.     No current facility-administered medications for this visit.     Past Surgical History:  Procedure Laterality Date  . BREAST REDUCTION SURGERY  2000  . 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 Tylenol With Codeine #3 [Acetaminophen-Codeine] Rash    itching  . Vicodin [Hydrocodone-Acetaminophen] Rash      Family History  Problem Relation Age of Onset  .  Hypertension Mother   . Heart attack Father   . Hypertension Brother   . Arthritis Brother        rheumatoid  . Diabetes Sister   . Cancer Sister        breast  . Schizophrenia Son      Social History Ms. Cillo reports that she has been smoking cigarettes. She started smoking about 47 years ago. She has been smoking about 0.25 packs per day. She has never used smokeless tobacco. Ms. Stroud reports no history of alcohol use.   Review of Systems CONSTITUTIONAL: No weight loss, fever, chills, weakness or fatigue.  HEENT: Eyes: No visual loss, blurred vision, double vision or yellow sclerae.No hearing loss, sneezing, congestion, runny nose or sore throat.  SKIN: No rash or itching.  CARDIOVASCULAR:  RESPIRATORY: No shortness of breath, cough or sputum.  GASTROINTESTINAL: No anorexia, nausea, vomiting or diarrhea. No abdominal pain or blood.  GENITOURINARY: No burning on urination, no polyuria NEUROLOGICAL: No headache, dizziness, syncope, paralysis, ataxia, numbness or tingling in the extremities. No change in bowel or bladder control.  MUSCULOSKELETAL: No muscle, back pain, joint pain or stiffness.  LYMPHATICS: No enlarged nodes. No history of splenectomy.  PSYCHIATRIC: No history of depression or anxiety.  ENDOCRINOLOGIC: No reports of sweating, cold or heat intolerance. No polyuria or polydipsia.  Marland Kitchen   Physical Examination There were no vitals filed for  this visit. There were no vitals filed for this visit.  Gen: resting comfortably, no acute distress HEENT: no scleral icterus, pupils equal round and reactive, no palptable cervical adenopathy,  CV Resp: Clear to auscultation bilaterally GI: abdomen is soft, non-tender, non-distended, normal bowel sounds, no hepatosplenomegaly MSK: extremities are warm, no edema.  Skin: warm, no rash Neuro:  no focal deficits Psych: appropriate affect   Diagnostic Studies     Assessment and Plan        Antoine Poche,  M.D., F.A.C.C.

## 2020-07-15 DIAGNOSIS — Z1231 Encounter for screening mammogram for malignant neoplasm of breast: Secondary | ICD-10-CM | POA: Diagnosis not present

## 2020-07-21 ENCOUNTER — Other Ambulatory Visit: Payer: Self-pay | Admitting: Cardiology

## 2020-09-30 DIAGNOSIS — R69 Illness, unspecified: Secondary | ICD-10-CM | POA: Diagnosis not present

## 2020-09-30 DIAGNOSIS — I10 Essential (primary) hypertension: Secondary | ICD-10-CM | POA: Diagnosis not present

## 2020-09-30 DIAGNOSIS — Z0001 Encounter for general adult medical examination with abnormal findings: Secondary | ICD-10-CM | POA: Diagnosis not present

## 2020-09-30 DIAGNOSIS — Z6824 Body mass index (BMI) 24.0-24.9, adult: Secondary | ICD-10-CM | POA: Diagnosis not present

## 2020-09-30 DIAGNOSIS — Z131 Encounter for screening for diabetes mellitus: Secondary | ICD-10-CM | POA: Diagnosis not present

## 2020-12-27 IMAGING — CT CT ABDOMEN AND PELVIS WITHOUT CONTRAST
1 of 2 series · 14 of 32 positions shown, 19 images · non-contrast
Comparison: Abdominal ultrasound 06/04/2018

CLINICAL DATA: Left-sided abdominal pain for 2 months.

EXAM:
CT ABDOMEN AND PELVIS WITHOUT CONTRAST
TECHNIQUE: Multidetector CT imaging of the abdomen and pelvis was performed
following the standard protocol without IV contrast.

[Series 2: abd/pelvis w/(date) · axial · 0.79mm/px · z∈[-479,-54]mm · 14 of 96 slices shown, 19 images]
[im 6/96  soft-tissue]
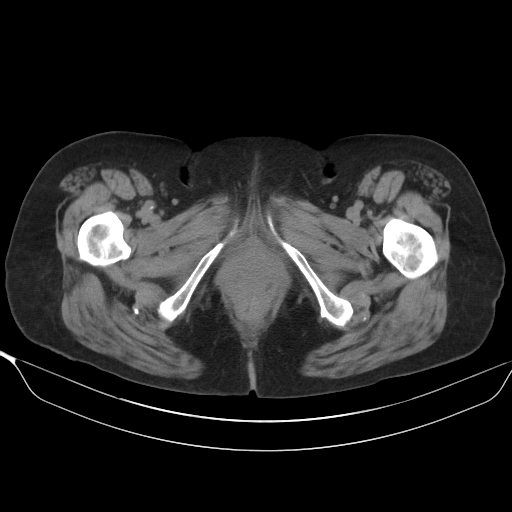
[im 6/96  bone]
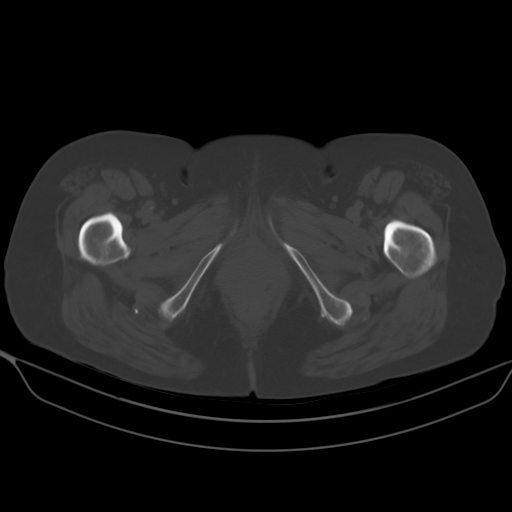
[im 16/96  soft-tissue]
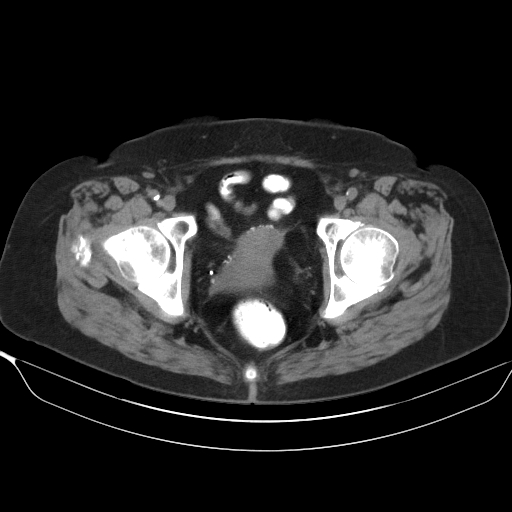
[im 21/96  soft-tissue]
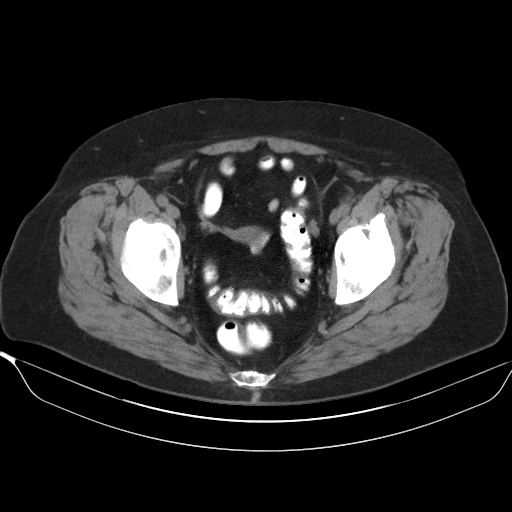
[im 26/96  soft-tissue]
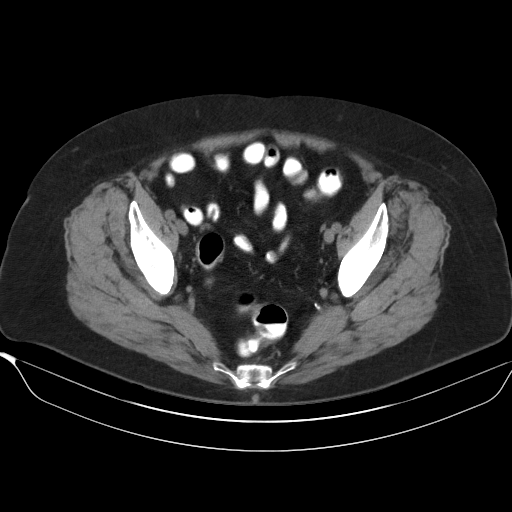
[im 36/96  soft-tissue]
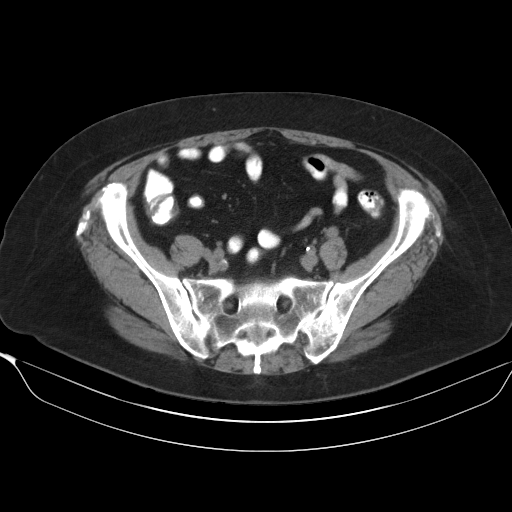
[im 41/96  soft-tissue]
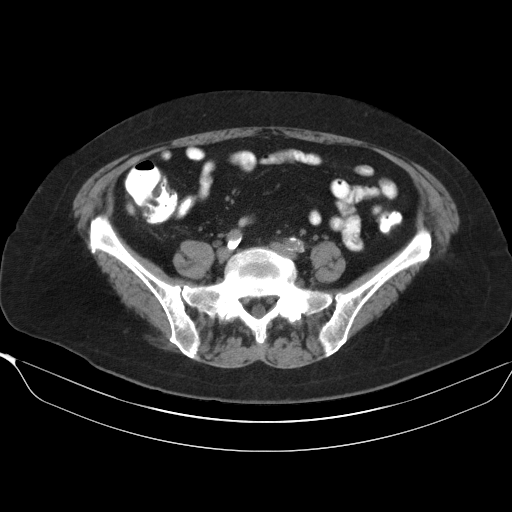
[im 51/96  soft-tissue]
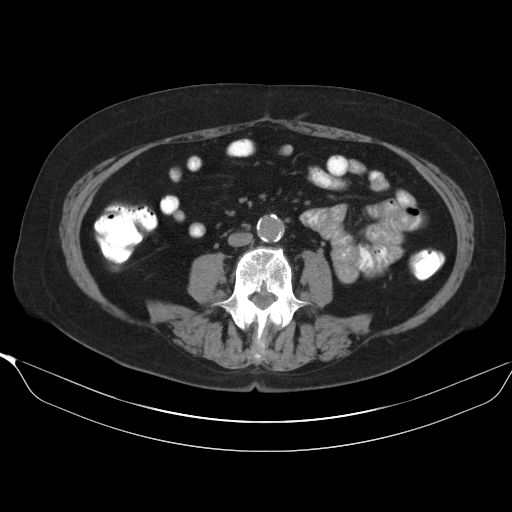
[im 56/96  soft-tissue]
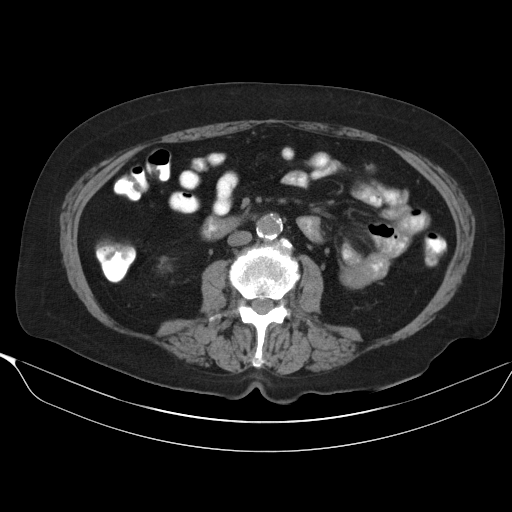
[im 61/96  soft-tissue]
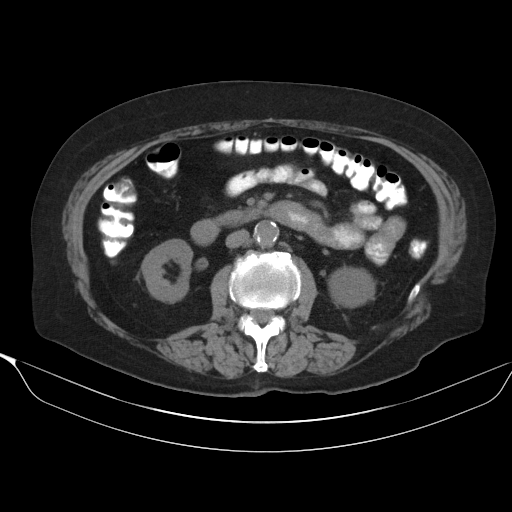
[im 61/96  bone]
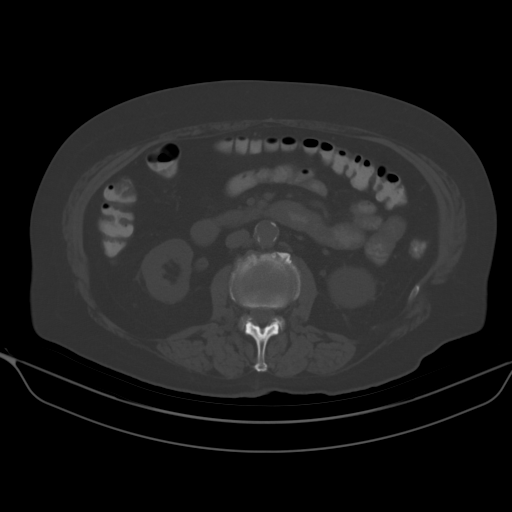
[im 71/96  soft-tissue]
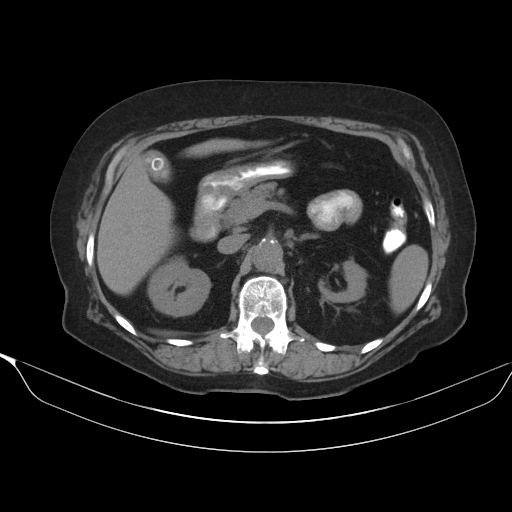
[im 76/96  soft-tissue]
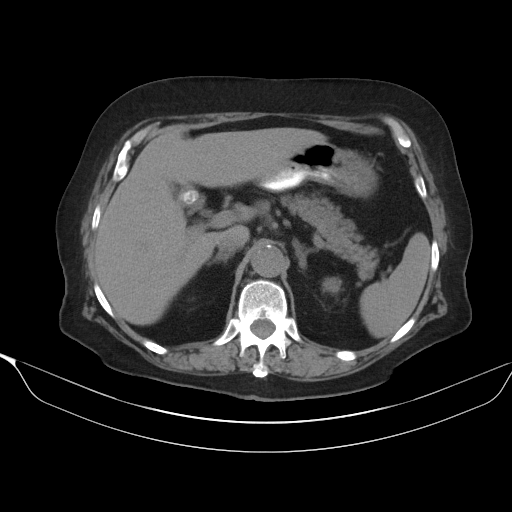
[im 76/96  lung]
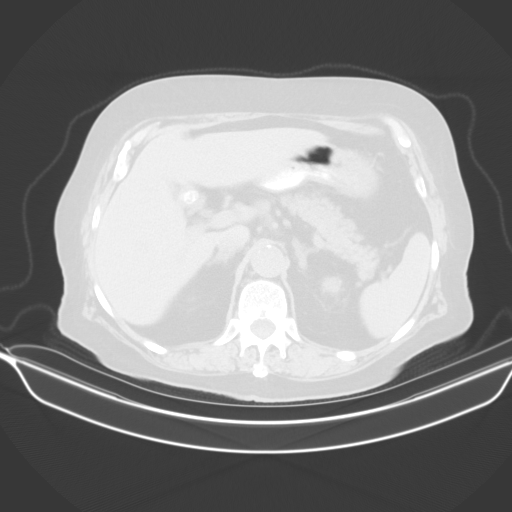
[im 81/96  soft-tissue]
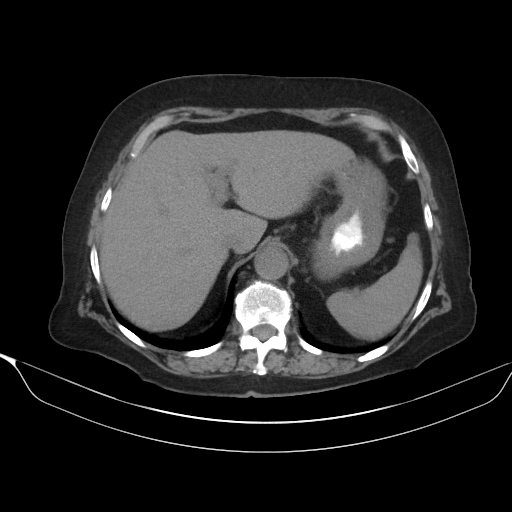
[im 81/96  lung]
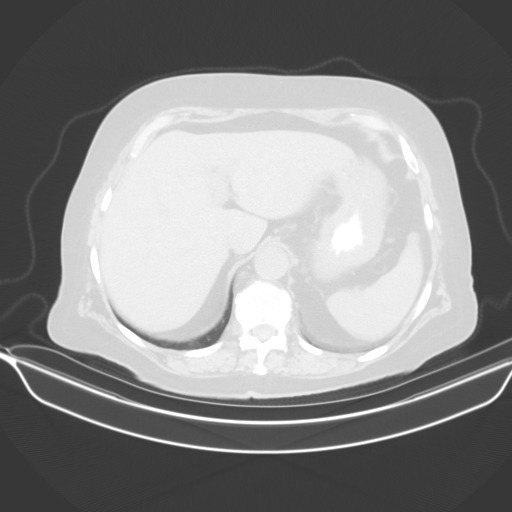
[im 86/96  lung]
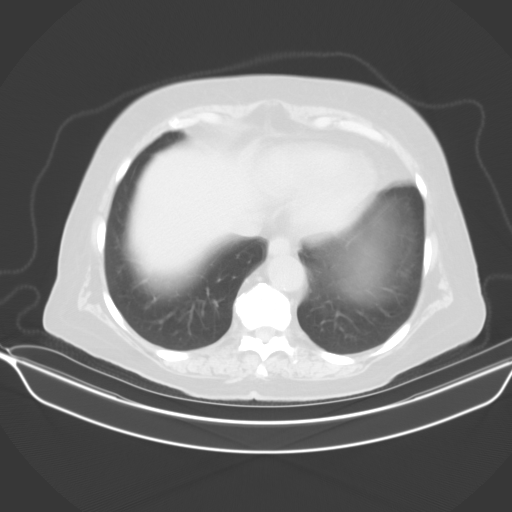
[im 91/96  soft-tissue]
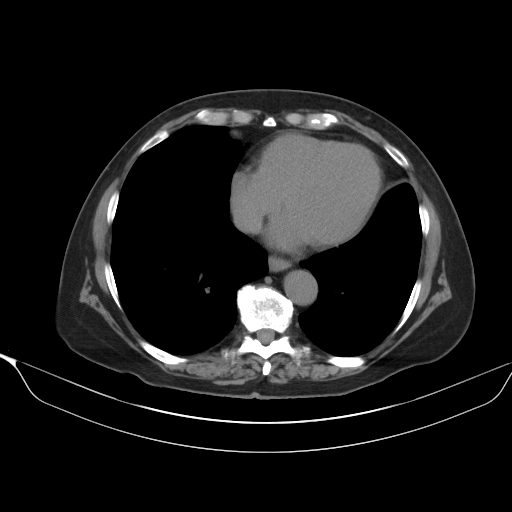
[im 91/96  lung]
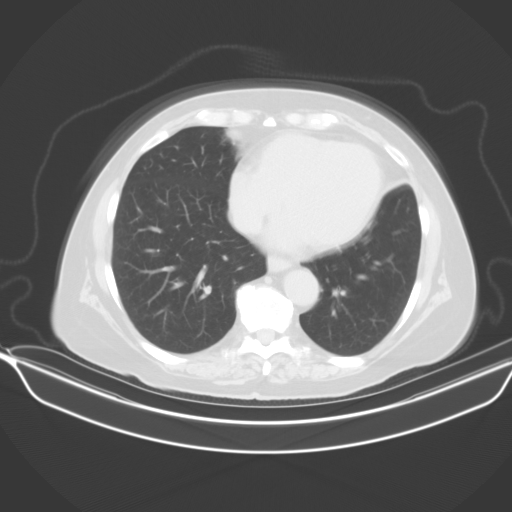

[14 of 32 positions shown; findings below may reference images not displayed]

FINDINGS: Lower chest: Mild atelectasis in the lingula. No pleural effusion.

Hepatobiliary: No focal liver abnormality is seen. Numerous stones
are again noted throughout the gallbladder without evidence of acute
cholecystitis or biliary dilatation.

Pancreas: Unremarkable.

Spleen: Unremarkable.

Adrenals/Urinary Tract: Unremarkable right adrenal gland. Mild left
adrenal gland thickening. Lobular contour of the left greater than
right kidneys with prominent parenchymal volume loss involving the
left upper pole. Mild nonspecific left greater than right
perinephric stranding. No renal or ureteral calculi or
hydroureteronephrosis. Vascular calcification in the right renal
hilum. Collapsed bladder.

Stomach/Bowel: The stomach is unremarkable. Oral contrast is present
throughout nondilated loops of small and large bowel to the level
the rectum. There is no evidence of bowel obstruction or
inflammation. The appendix is unremarkable.

Vascular/Lymphatic: Extensive aortic atherosclerosis without
aneurysm. Mildly prominent number of subcentimeter. No frankly
enlarged lymph nodes in the abdomen or pelvis.

Reproductive: Unremarkable uterus and ovaries.

Other: No intraperitoneal free fluid.

Musculoskeletal: No acute osseous abnormality or suspicious osseous
lesion.
IMPRESSION: 1. No acute abnormality identified in the abdomen or pelvis.
2. Asymmetric left renal atrophy and nonspecific bilateral
perinephric stranding. No urolithiasis or urinary tract obstruction.
3. Cholelithiasis.
4. Aortic Atherosclerosis ([id number]-[id number].R).

## 2021-03-31 DIAGNOSIS — N183 Chronic kidney disease, stage 3 unspecified: Secondary | ICD-10-CM | POA: Diagnosis not present

## 2021-03-31 DIAGNOSIS — Z6824 Body mass index (BMI) 24.0-24.9, adult: Secondary | ICD-10-CM | POA: Diagnosis not present

## 2021-03-31 DIAGNOSIS — E785 Hyperlipidemia, unspecified: Secondary | ICD-10-CM | POA: Diagnosis not present

## 2021-03-31 DIAGNOSIS — Z13820 Encounter for screening for osteoporosis: Secondary | ICD-10-CM | POA: Diagnosis not present

## 2021-03-31 DIAGNOSIS — M6284 Sarcopenia: Secondary | ICD-10-CM | POA: Diagnosis not present

## 2021-03-31 DIAGNOSIS — F17210 Nicotine dependence, cigarettes, uncomplicated: Secondary | ICD-10-CM | POA: Diagnosis not present

## 2021-03-31 DIAGNOSIS — I10 Essential (primary) hypertension: Secondary | ICD-10-CM | POA: Diagnosis not present

## 2021-04-04 DIAGNOSIS — G4700 Insomnia, unspecified: Secondary | ICD-10-CM | POA: Diagnosis not present

## 2021-04-04 DIAGNOSIS — I10 Essential (primary) hypertension: Secondary | ICD-10-CM | POA: Diagnosis not present

## 2021-04-04 DIAGNOSIS — N184 Chronic kidney disease, stage 4 (severe): Secondary | ICD-10-CM | POA: Diagnosis not present

## 2021-04-04 DIAGNOSIS — F17210 Nicotine dependence, cigarettes, uncomplicated: Secondary | ICD-10-CM | POA: Diagnosis not present

## 2021-04-28 DIAGNOSIS — Z716 Tobacco abuse counseling: Secondary | ICD-10-CM | POA: Diagnosis not present

## 2021-04-28 DIAGNOSIS — I5032 Chronic diastolic (congestive) heart failure: Secondary | ICD-10-CM | POA: Diagnosis not present

## 2021-04-28 DIAGNOSIS — R011 Cardiac murmur, unspecified: Secondary | ICD-10-CM | POA: Diagnosis not present

## 2021-04-28 DIAGNOSIS — R809 Proteinuria, unspecified: Secondary | ICD-10-CM | POA: Diagnosis not present

## 2021-04-28 DIAGNOSIS — I129 Hypertensive chronic kidney disease with stage 1 through stage 4 chronic kidney disease, or unspecified chronic kidney disease: Secondary | ICD-10-CM | POA: Diagnosis not present

## 2021-04-28 DIAGNOSIS — D638 Anemia in other chronic diseases classified elsewhere: Secondary | ICD-10-CM | POA: Diagnosis not present

## 2021-04-28 DIAGNOSIS — N1832 Chronic kidney disease, stage 3b: Secondary | ICD-10-CM | POA: Diagnosis not present

## 2021-04-28 DIAGNOSIS — K8020 Calculus of gallbladder without cholecystitis without obstruction: Secondary | ICD-10-CM | POA: Diagnosis not present

## 2021-05-02 ENCOUNTER — Other Ambulatory Visit (HOSPITAL_COMMUNITY): Payer: Self-pay | Admitting: Nephrology

## 2021-05-02 DIAGNOSIS — I129 Hypertensive chronic kidney disease with stage 1 through stage 4 chronic kidney disease, or unspecified chronic kidney disease: Secondary | ICD-10-CM

## 2021-05-02 DIAGNOSIS — D638 Anemia in other chronic diseases classified elsewhere: Secondary | ICD-10-CM | POA: Diagnosis not present

## 2021-05-02 DIAGNOSIS — N1832 Chronic kidney disease, stage 3b: Secondary | ICD-10-CM

## 2021-05-02 DIAGNOSIS — N181 Chronic kidney disease, stage 1: Secondary | ICD-10-CM

## 2021-05-02 DIAGNOSIS — I5032 Chronic diastolic (congestive) heart failure: Secondary | ICD-10-CM

## 2021-05-02 DIAGNOSIS — R809 Proteinuria, unspecified: Secondary | ICD-10-CM | POA: Diagnosis not present

## 2021-05-02 DIAGNOSIS — K8020 Calculus of gallbladder without cholecystitis without obstruction: Secondary | ICD-10-CM | POA: Diagnosis not present

## 2021-05-19 ENCOUNTER — Other Ambulatory Visit: Payer: Self-pay

## 2021-05-19 ENCOUNTER — Ambulatory Visit (HOSPITAL_COMMUNITY)
Admission: RE | Admit: 2021-05-19 | Discharge: 2021-05-19 | Disposition: A | Discharge status: Home / Self Care | Payer: No Typology Code available for payment source | Source: Ambulatory Visit | Attending: Nephrology | Admitting: Nephrology

## 2021-05-19 DIAGNOSIS — N1832 Chronic kidney disease, stage 3b: Secondary | ICD-10-CM | POA: Diagnosis not present

## 2021-05-19 DIAGNOSIS — I129 Hypertensive chronic kidney disease with stage 1 through stage 4 chronic kidney disease, or unspecified chronic kidney disease: Secondary | ICD-10-CM | POA: Diagnosis not present

## 2021-05-19 DIAGNOSIS — N181 Chronic kidney disease, stage 1: Secondary | ICD-10-CM | POA: Insufficient documentation

## 2021-05-19 DIAGNOSIS — I5032 Chronic diastolic (congestive) heart failure: Secondary | ICD-10-CM | POA: Diagnosis not present

## 2021-05-19 DIAGNOSIS — R809 Proteinuria, unspecified: Secondary | ICD-10-CM | POA: Diagnosis not present

## 2021-05-20 DIAGNOSIS — R894 Abnormal immunological findings in specimens from other organs, systems and tissues: Secondary | ICD-10-CM | POA: Diagnosis not present

## 2021-05-20 DIAGNOSIS — Z716 Tobacco abuse counseling: Secondary | ICD-10-CM | POA: Diagnosis not present

## 2021-05-20 DIAGNOSIS — I129 Hypertensive chronic kidney disease with stage 1 through stage 4 chronic kidney disease, or unspecified chronic kidney disease: Secondary | ICD-10-CM | POA: Diagnosis not present

## 2021-05-20 DIAGNOSIS — N1832 Chronic kidney disease, stage 3b: Secondary | ICD-10-CM | POA: Diagnosis not present

## 2021-05-20 DIAGNOSIS — I5032 Chronic diastolic (congestive) heart failure: Secondary | ICD-10-CM | POA: Diagnosis not present

## 2021-05-25 ENCOUNTER — Other Ambulatory Visit (HOSPITAL_COMMUNITY): Payer: Self-pay | Admitting: Nephrology

## 2021-05-25 ENCOUNTER — Other Ambulatory Visit: Payer: Self-pay | Admitting: Nephrology

## 2021-05-25 DIAGNOSIS — R768 Other specified abnormal immunological findings in serum: Secondary | ICD-10-CM

## 2021-05-25 DIAGNOSIS — I5032 Chronic diastolic (congestive) heart failure: Secondary | ICD-10-CM

## 2021-05-25 DIAGNOSIS — N1832 Chronic kidney disease, stage 3b: Secondary | ICD-10-CM

## 2021-05-25 DIAGNOSIS — I129 Hypertensive chronic kidney disease with stage 1 through stage 4 chronic kidney disease, or unspecified chronic kidney disease: Secondary | ICD-10-CM

## 2021-05-29 ENCOUNTER — Other Ambulatory Visit: Payer: Self-pay

## 2021-05-29 NOTE — Telephone Encounter (Signed)
SK patient ?Last OV: 03/31/19 (telehealth) with instructions to follow up in a year. ?No future appointment scheduled. ?

## 2021-05-30 ENCOUNTER — Other Ambulatory Visit: Payer: Self-pay | Admitting: Student

## 2021-05-30 MED ORDER — HYDRALAZINE HCL 25 MG PO TABS: 25.0000 mg | ORAL_TABLET | Freq: Two times a day (BID) | ORAL | 0 refills | Status: DC

## 2021-05-31 ENCOUNTER — Other Ambulatory Visit: Payer: Self-pay | Admitting: *Deleted

## 2021-05-31 NOTE — Telephone Encounter (Signed)
Hydralazine 25 mg refill request refilled yesterday and faxed to [facility] ?Need visit ?

## 2021-06-15 ENCOUNTER — Other Ambulatory Visit: Payer: Self-pay | Admitting: Radiology

## 2021-06-16 ENCOUNTER — Other Ambulatory Visit: Payer: Self-pay

## 2021-06-16 ENCOUNTER — Encounter (HOSPITAL_COMMUNITY): Payer: Self-pay

## 2021-06-16 ENCOUNTER — Ambulatory Visit (HOSPITAL_COMMUNITY)
Admission: RE | Admit: 2021-06-16 | Discharge: 2021-06-16 | Disposition: A | Discharge status: Home / Self Care | Payer: No Typology Code available for payment source | Source: Ambulatory Visit | Attending: Nephrology | Admitting: Nephrology

## 2021-06-16 DIAGNOSIS — I509 Heart failure, unspecified: Secondary | ICD-10-CM | POA: Insufficient documentation

## 2021-06-16 DIAGNOSIS — I7782 Antineutrophilic cytoplasmic antibody (ANCA) vasculitis: Secondary | ICD-10-CM | POA: Diagnosis not present

## 2021-06-16 DIAGNOSIS — I130 Hypertensive heart and chronic kidney disease with heart failure and stage 1 through stage 4 chronic kidney disease, or unspecified chronic kidney disease: Secondary | ICD-10-CM | POA: Insufficient documentation

## 2021-06-16 DIAGNOSIS — I129 Hypertensive chronic kidney disease with stage 1 through stage 4 chronic kidney disease, or unspecified chronic kidney disease: Secondary | ICD-10-CM

## 2021-06-16 DIAGNOSIS — R768 Other specified abnormal immunological findings in serum: Secondary | ICD-10-CM

## 2021-06-16 DIAGNOSIS — N189 Chronic kidney disease, unspecified: Secondary | ICD-10-CM | POA: Insufficient documentation

## 2021-06-16 DIAGNOSIS — I5032 Chronic diastolic (congestive) heart failure: Secondary | ICD-10-CM

## 2021-06-16 DIAGNOSIS — N1832 Chronic kidney disease, stage 3b: Secondary | ICD-10-CM

## 2021-06-16 LAB — CBC
HCT: 41.1 % (ref 36.0–46.0)
Hemoglobin: 13.8 g/dL (ref 12.0–15.0)
MCH: 30.7 pg (ref 26.0–34.0)
MCHC: 33.6 g/dL (ref 30.0–36.0)
MCV: 91.3 fL (ref 80.0–100.0)
Platelets: 166 10*3/uL (ref 150–400)
RBC: 4.5 MIL/uL (ref 3.87–5.11)
RDW: 13 % (ref 11.5–15.5)
WBC: 6.8 10*3/uL (ref 4.0–10.5)
nRBC: 0 % (ref 0.0–0.2)

## 2021-06-16 LAB — PROTIME-INR
INR: 1 (ref 0.8–1.2)
Prothrombin Time: 13.4 seconds (ref 11.4–15.2)

## 2021-06-16 MED ORDER — FENTANYL CITRATE (PF) 100 MCG/2ML IJ SOLN
INTRAMUSCULAR | Status: AC | PRN
  Administered 2021-06-16: 25 ug via INTRAVENOUS

## 2021-06-16 MED ORDER — MIDAZOLAM HCL 2 MG/2ML IJ SOLN
INTRAMUSCULAR | Status: AC
  Filled 2021-06-16: qty 2

## 2021-06-16 MED ORDER — HYDRALAZINE HCL 20 MG/ML IJ SOLN
INTRAMUSCULAR | Status: AC
  Filled 2021-06-16: qty 1

## 2021-06-16 MED ORDER — FENTANYL CITRATE (PF) 100 MCG/2ML IJ SOLN
INTRAMUSCULAR | Status: AC
Start: 2021-06-16 — End: 2021-06-16
  Filled 2021-06-16: qty 2

## 2021-06-16 MED ORDER — LIDOCAINE HCL (PF) 1 % IJ SOLN
INTRAMUSCULAR | Status: AC
Start: 2021-06-16 — End: 2021-06-16
  Filled 2021-06-16: qty 30

## 2021-06-16 MED ORDER — MIDAZOLAM HCL 2 MG/2ML IJ SOLN
INTRAMUSCULAR | Status: AC | PRN
Start: 2021-06-16 — End: 2021-06-16
  Administered 2021-06-16: 1 mg via INTRAVENOUS

## 2021-06-16 MED ORDER — GELATIN ABSORBABLE 12-7 MM EX MISC
CUTANEOUS | Status: AC
  Filled 2021-06-16: qty 1

## 2021-06-16 MED ORDER — HYDRALAZINE HCL 20 MG/ML IJ SOLN
INTRAMUSCULAR | Status: AC | PRN
  Administered 2021-06-16: 5 mg via INTRAVENOUS

## 2021-06-16 MED ORDER — SODIUM CHLORIDE 0.9 % IV SOLN: INTRAVENOUS | Status: DC

## 2021-06-16 NOTE — H&P (Signed)
? ?Chief Complaint: ?Patient was seen in consultation today for random renal biopsy at the request of [place] S ? ?Referring Physician(s): ?Bhutani,Manpreet S ? ?Supervising Physician: Aletta Edouard ? ?Patient Status: [facility] - Out-pt ? ?History of Present Illness: ?Samantha Bruce is a 71 y.o. female  ? ?HTN; CHF;  ?Chronic kidney disease ?+ANCA ? ?Known abnormal renal functions at PMD for over 1 year per pt and sister in room ?Noted recent worsening labs and referred to Dr Theador Hawthorne ?Scheduled now for random renal biopsy ? ? ? ?Past Medical History:  ?Diagnosis Date  ? COPD (chronic obstructive pulmonary disease) ([place])   ? Hyperlipidemia   ? Hypertension   ? Vertigo   ? Vitamin D deficiency   ? ? ?Past Surgical History:  ?Procedure Laterality Date  ? BREAST REDUCTION SURGERY  2000  ? OTHER SURGICAL HISTORY  1990s  ? 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 ? ?Allergies: ?Tylenol with codeine #3 [acetaminophen-codeine] and Vicodin [hydrocodone-acetaminophen] ? ?Medications: ?Prior to Admission medications   ?Medication Sig Start Date End Date Taking? Authorizing Provider  ?acetaminophen (TYLENOL) 500 MG tablet Take 500 mg by mouth every 6 (six) hours as needed for moderate pain.   Yes [provider]  ?carvedilol (COREG) 12.5 MG tablet Take 12.5 mg by mouth 2 (two) times daily. 05/20/21  Yes [provider]  ?cholecalciferol (VITAMIN D3) 25 MCG (1000 UNIT) tablet Take 1,000 Units by mouth daily.   Yes [provider]  ?indapamide (LOZOL) 2.5 MG tablet Take 2.5 mg by mouth daily. 02/17/21  Yes [provider]  ?losartan (COZAAR) 50 MG tablet Take 50 mg by mouth daily.   Yes [provider]  ?Melatonin 5 MG CAPS Take 5 mg by mouth at bedtime.   Yes [provider]  ?Menthol, Topical Analgesic, (ICY HOT EX) Apply 1 application. topically daily as needed (pain).   Yes [provider]  ?Multiple Minerals (CALCIUM-MAGNESIUM-ZINC) TABS Take 1  tablet by mouth daily.   Yes [provider]  ?Omega-3 Fatty Acids (FISH OIL PO) Take 1,576 mg by mouth 2 (two) times daily.   Yes [provider]  ?sodium chloride (OCEAN) 0.65 % SOLN nasal spray Place 1 spray into both nostrils as needed for congestion.   Yes [provider]  ?hydrALAZINE (APRESOLINE) 25 MG tablet Take 1 tablet (25 mg total) by mouth 2 (two) times daily. ?Patient not taking: Reported on 06/14/2021 05/30/21   Erma Heritage, PA-C  ?  ? ?Family History  ?Problem Relation Age of Onset  ? Hypertension Mother   ? Heart attack Father   ? Hypertension Brother   ? Arthritis Brother   ?     rheumatoid  ? Diabetes Sister   ? Cancer Sister   ?     breast  ? Schizophrenia Son   ? ? ?Social History  ? ?Socioeconomic History  ? Marital status: Married  ?  Spouse name: Not on file  ? Number of children: Not on file  ? Years of education: Not on file  ? Highest education level: Not on file  ?Occupational History  ? Not on file  ?Tobacco Use  ? Smoking status: Light Smoker  ?  Packs/day: 0.25  ?  Types: Cigarettes  ?  Start date: 04/16/1973  ? Smokeless tobacco: Never  ? Tobacco comments:  ?  1 pk every 3 days  ?Substance and Sexual Activity  ? Alcohol use: No  ? Drug use: No  ?  Sexual activity: Not on file  ?Other Topics Concern  ? Not on file  ?Social History Narrative  ? Lives at home. Retired.  Education 11th grade.  4 Children.  Caffeine decaf coffee 2 cups daily.  ? ?Social Determinants of Health  ? ?Financial Resource Strain: Not on file  ?Food Insecurity: Not on file  ?Transportation Needs: Not on file  ?Physical Activity: Not on file  ?Stress: Not on file  ?Social Connections: Not on file  ? ? ? ?Review of Systems: A 12 point ROS discussed and pertinent positives are indicated in the HPI above.  All other systems are negative. ? ?Review of Systems  ?Constitutional:  Negative for activity change.  ?HENT:  Positive for hearing loss.   ?Respiratory:  Negative for cough and  shortness of breath.   ?Cardiovascular:  Negative for chest pain.  ?Gastrointestinal:  Negative for abdominal pain and nausea.  ?Neurological:  Negative for weakness.  ?Psychiatric/Behavioral:  Negative for behavioral problems and confusion.   ? ?Vital Signs: ?BP (!) 187/60   Pulse (!) 52   Temp 98.1 ?F (36.7 ?C) (Oral)   Resp 18   Ht 5\' 7"  (1.702 m)   Wt 150 lb (68 kg)   SpO2 97%   BMI 23.49 kg/m?  ? ?Physical Exam ?Vitals reviewed.  ?HENT:  ?   Mouth/Throat:  ?   Mouth: Mucous membranes are moist.  ?Cardiovascular:  ?   Rate and Rhythm: Normal rate and regular rhythm.  ?   Heart sounds: Murmur heard.  ?Pulmonary:  ?   Effort: Pulmonary effort is normal.  ?   Breath sounds: Normal breath sounds.  ?Abdominal:  ?   Palpations: Abdomen is soft.  ?Musculoskeletal:     ?   General: Normal range of motion.  ?Skin: ?   General: Skin is warm.  ?Neurological:  ?   Mental Status: She is oriented to person, place, and time.  ?Psychiatric:     ?   Behavior: Behavior normal.  ? ? ?Imaging: ?US RENAL ? ?Result Date: 05/19/2021 ?CLINICAL DATA:  Initial evaluation for stage III B chronic kidney disease. EXAM: RENAL / URINARY TRACT ULTRASOUND COMPLETE COMPARISON:  Prior CT from 06/27/2018. FINDINGS: Right Kidney: Renal measurements: 9.0 x 4.8 x 5.1 cm = volume: 114.7 mL. Renal echogenicity within normal limits. No nephrolithiasis or hydronephrosis. No focal renal mass. Left Kidney: Renal measurements: 8.1 x 4.4 x 4.8 cm = volume: 90.6 mL. Renal echogenicity within normal limits. Diffuse cortical thinning noted about the upper pole. No nephrolithiasis or hydronephrosis. No focal renal mass. Bladder: Appears normal for degree of bladder distention. Other: None. IMPRESSION: 1. Cortical thinning about the upper pole of the left kidney. 2. Otherwise unremarkable renal ultrasound. No hydronephrosis or other significant finding. Electronically Signed   By: Jeannine Boga M.D.   On: 05/19/2021 19:44   ? ?Labs: ? ?CBC: ?No  results for input(s): WBC, HGB, HCT, PLT in the last 8760 hours. ? ?COAGS: ?No results for input(s): INR, APTT in the last 8760 hours. ? ?BMP: ?No results for input(s): NA, K, CL, CO2, GLUCOSE, BUN, CALCIUM, CREATININE, GFRNONAA, GFRAA in the last 8760 hours. ? ?Invalid input(s): CMP ? ?LIVER FUNCTION TESTS: ?No results for input(s): BILITOT, AST, ALT, ALKPHOS, PROT, ALBUMIN in the last 8760 hours. ? ?TUMOR MARKERS: ?No results for input(s): AFPTM, CEA, CA199, CHROMGRNA in the last 8760 hours. ? ?Assessment and Plan: ? ?HTN; CHF ?CKD ?+ANCA ?Scheduled for random renal biopsy per Nephrology ?Risks and benefits  of random renal biopsy was discussed with the patient and/or patient's family including, but not limited to bleeding, infection, damage to adjacent structures or low yield requiring additional tests. ? ?All of the questions were answered and there is agreement to proceed. ? ?Consent signed and in chart.  ? ? ?Thank you for this interesting consult.  I greatly enjoyed meeting Samantha Bruce and look forward to participating in their care.  A copy of this report was sent to the requesting provider on this date. ? ?Electronically Signed: ?Lavonia Drafts, PA-C ?06/16/2021, 7:32 AM ? ? ?I spent a total of  30 Minutes   in face to face in clinical consultation, greater than 50% of which was counseling/coordinating care for random renal biopsy  ?

## 2021-06-16 NOTE — Progress Notes (Signed)
Patient and family was given discharge instructions, both verbalized understanding. ?

## 2021-06-16 NOTE — Procedures (Signed)
Interventional Radiology Procedure Note ? ?Procedure: Ultrasound guided renal biopsy ? ?Findings: Please refer to procedural dictation for full description. Right lower pole 16 ga core bx x2, gelfoam needle track embolization. ? ?Complications: None immediate ? ?Estimated Blood Loss: < 5 mL ? ?Recommendations: ?Strict 3 hour bedrest. ?Follow up Pathology results. ? ? ?Ruthann Cancer, MD ?Pager: [phone] ? ? ? ?

## 2021-06-20 LAB — SURGICAL PATHOLOGY

## 2021-06-21 ENCOUNTER — Encounter (HOSPITAL_COMMUNITY): Payer: Self-pay

## 2021-06-30 DIAGNOSIS — N189 Chronic kidney disease, unspecified: Secondary | ICD-10-CM | POA: Diagnosis not present

## 2021-06-30 DIAGNOSIS — I129 Hypertensive chronic kidney disease with stage 1 through stage 4 chronic kidney disease, or unspecified chronic kidney disease: Secondary | ICD-10-CM | POA: Diagnosis not present

## 2021-06-30 DIAGNOSIS — I5032 Chronic diastolic (congestive) heart failure: Secondary | ICD-10-CM | POA: Diagnosis not present

## 2021-06-30 DIAGNOSIS — R894 Abnormal immunological findings in specimens from other organs, systems and tissues: Secondary | ICD-10-CM | POA: Diagnosis not present

## 2021-06-30 DIAGNOSIS — N183 Chronic kidney disease, stage 3 unspecified: Secondary | ICD-10-CM | POA: Diagnosis not present

## 2021-07-13 DIAGNOSIS — I5032 Chronic diastolic (congestive) heart failure: Secondary | ICD-10-CM | POA: Diagnosis not present

## 2021-07-13 DIAGNOSIS — N1832 Chronic kidney disease, stage 3b: Secondary | ICD-10-CM | POA: Diagnosis not present

## 2021-07-13 DIAGNOSIS — R894 Abnormal immunological findings in specimens from other organs, systems and tissues: Secondary | ICD-10-CM | POA: Diagnosis not present

## 2021-07-13 DIAGNOSIS — I129 Hypertensive chronic kidney disease with stage 1 through stage 4 chronic kidney disease, or unspecified chronic kidney disease: Secondary | ICD-10-CM | POA: Diagnosis not present

## 2021-07-14 DIAGNOSIS — I5032 Chronic diastolic (congestive) heart failure: Secondary | ICD-10-CM | POA: Diagnosis not present

## 2021-07-14 DIAGNOSIS — I129 Hypertensive chronic kidney disease with stage 1 through stage 4 chronic kidney disease, or unspecified chronic kidney disease: Secondary | ICD-10-CM | POA: Diagnosis not present

## 2021-07-14 DIAGNOSIS — N1832 Chronic kidney disease, stage 3b: Secondary | ICD-10-CM | POA: Diagnosis not present

## 2021-07-14 DIAGNOSIS — R894 Abnormal immunological findings in specimens from other organs, systems and tissues: Secondary | ICD-10-CM | POA: Diagnosis not present

## 2021-07-24 ENCOUNTER — Other Ambulatory Visit (HOSPITAL_COMMUNITY): Payer: Self-pay | Admitting: Nephrology

## 2021-07-24 DIAGNOSIS — N1832 Chronic kidney disease, stage 3b: Secondary | ICD-10-CM

## 2021-07-28 DIAGNOSIS — Z1231 Encounter for screening mammogram for malignant neoplasm of breast: Secondary | ICD-10-CM | POA: Diagnosis not present

## 2021-08-01 ENCOUNTER — Ambulatory Visit (HOSPITAL_COMMUNITY)
Admission: RE | Admit: 2021-08-01 | Discharge: 2021-08-01 | Disposition: A | Discharge status: Home / Self Care | Payer: No Typology Code available for payment source | Source: Ambulatory Visit | Attending: Nephrology | Admitting: Nephrology

## 2021-08-01 DIAGNOSIS — I080 Rheumatic disorders of both mitral and aortic valves: Secondary | ICD-10-CM | POA: Insufficient documentation

## 2021-08-01 DIAGNOSIS — I10 Essential (primary) hypertension: Secondary | ICD-10-CM

## 2021-08-01 DIAGNOSIS — I1310 Hypertensive heart and chronic kidney disease without heart failure, with stage 1 through stage 4 chronic kidney disease, or unspecified chronic kidney disease: Secondary | ICD-10-CM | POA: Diagnosis not present

## 2021-08-01 DIAGNOSIS — N1832 Chronic kidney disease, stage 3b: Secondary | ICD-10-CM | POA: Diagnosis not present

## 2021-08-01 DIAGNOSIS — J449 Chronic obstructive pulmonary disease, unspecified: Secondary | ICD-10-CM | POA: Insufficient documentation

## 2021-08-01 DIAGNOSIS — E785 Hyperlipidemia, unspecified: Secondary | ICD-10-CM | POA: Insufficient documentation

## 2021-08-01 LAB — ECHOCARDIOGRAM COMPLETE
AR max vel: 1.34 cm2
AV Area VTI: 1.67 cm2
AV Area mean vel: 1.41 cm2
AV Mean grad: 6.8 mmHg
AV Peak grad: 13.6 mmHg
Ao pk vel: 1.85 m/s
Area-P 1/2: 2.69 cm2
S' Lateral: 2.9 cm

## 2021-08-01 NOTE — Progress Notes (Signed)
*  PRELIMINARY RESULTS* ?Echocardiogram ?2D Echocardiogram has been performed. ? ?Stacey Drain ?08/01/2021, 10:25 AM ?

## 2021-08-10 DIAGNOSIS — H01001 Unspecified blepharitis right upper eyelid: Secondary | ICD-10-CM | POA: Diagnosis not present

## 2021-08-10 DIAGNOSIS — H02834 Dermatochalasis of left upper eyelid: Secondary | ICD-10-CM | POA: Diagnosis not present

## 2021-08-10 DIAGNOSIS — H01005 Unspecified blepharitis left lower eyelid: Secondary | ICD-10-CM | POA: Diagnosis not present

## 2021-08-10 DIAGNOSIS — H02831 Dermatochalasis of right upper eyelid: Secondary | ICD-10-CM | POA: Diagnosis not present

## 2021-08-10 DIAGNOSIS — H01002 Unspecified blepharitis right lower eyelid: Secondary | ICD-10-CM | POA: Diagnosis not present

## 2021-08-10 DIAGNOSIS — H25813 Combined forms of age-related cataract, bilateral: Secondary | ICD-10-CM | POA: Diagnosis not present

## 2021-08-10 DIAGNOSIS — H01004 Unspecified blepharitis left upper eyelid: Secondary | ICD-10-CM | POA: Diagnosis not present

## 2021-08-18 DIAGNOSIS — N1832 Chronic kidney disease, stage 3b: Secondary | ICD-10-CM | POA: Diagnosis not present

## 2021-08-18 DIAGNOSIS — I10 Essential (primary) hypertension: Secondary | ICD-10-CM | POA: Diagnosis not present

## 2021-08-18 DIAGNOSIS — R894 Abnormal immunological findings in specimens from other organs, systems and tissues: Secondary | ICD-10-CM | POA: Diagnosis not present

## 2021-08-18 DIAGNOSIS — I5032 Chronic diastolic (congestive) heart failure: Secondary | ICD-10-CM | POA: Diagnosis not present

## 2021-08-18 DIAGNOSIS — R5383 Other fatigue: Secondary | ICD-10-CM | POA: Diagnosis not present

## 2021-08-24 DIAGNOSIS — D841 Defects in the complement system: Secondary | ICD-10-CM | POA: Diagnosis not present

## 2021-08-24 DIAGNOSIS — R011 Cardiac murmur, unspecified: Secondary | ICD-10-CM | POA: Diagnosis not present

## 2021-08-24 DIAGNOSIS — R894 Abnormal immunological findings in specimens from other organs, systems and tissues: Secondary | ICD-10-CM | POA: Diagnosis not present

## 2021-08-24 DIAGNOSIS — I129 Hypertensive chronic kidney disease with stage 1 through stage 4 chronic kidney disease, or unspecified chronic kidney disease: Secondary | ICD-10-CM | POA: Diagnosis not present

## 2021-08-24 DIAGNOSIS — I5032 Chronic diastolic (congestive) heart failure: Secondary | ICD-10-CM | POA: Diagnosis not present

## 2021-08-24 DIAGNOSIS — N184 Chronic kidney disease, stage 4 (severe): Secondary | ICD-10-CM | POA: Diagnosis not present

## 2021-08-29 ENCOUNTER — Other Ambulatory Visit: Payer: Self-pay | Admitting: Nephrology

## 2021-08-29 ENCOUNTER — Other Ambulatory Visit (HOSPITAL_COMMUNITY): Payer: Self-pay | Admitting: Nephrology

## 2021-08-29 DIAGNOSIS — D841 Defects in the complement system: Secondary | ICD-10-CM

## 2021-08-29 DIAGNOSIS — I129 Hypertensive chronic kidney disease with stage 1 through stage 4 chronic kidney disease, or unspecified chronic kidney disease: Secondary | ICD-10-CM

## 2021-09-21 ENCOUNTER — Other Ambulatory Visit (HOSPITAL_COMMUNITY): Payer: Self-pay | Admitting: Student

## 2021-09-21 DIAGNOSIS — N1832 Chronic kidney disease, stage 3b: Secondary | ICD-10-CM

## 2021-09-22 ENCOUNTER — Encounter (HOSPITAL_COMMUNITY): Payer: Self-pay

## 2021-09-22 ENCOUNTER — Ambulatory Visit (HOSPITAL_COMMUNITY)
Admission: RE | Admit: 2021-09-22 | Discharge: 2021-09-22 | Disposition: A | Discharge status: Home / Self Care | Payer: No Typology Code available for payment source | Source: Ambulatory Visit | Attending: Nephrology | Admitting: Nephrology

## 2021-09-22 DIAGNOSIS — I129 Hypertensive chronic kidney disease with stage 1 through stage 4 chronic kidney disease, or unspecified chronic kidney disease: Secondary | ICD-10-CM | POA: Insufficient documentation

## 2021-09-22 DIAGNOSIS — R011 Cardiac murmur, unspecified: Secondary | ICD-10-CM | POA: Insufficient documentation

## 2021-09-22 DIAGNOSIS — R894 Abnormal immunological findings in specimens from other organs, systems and tissues: Secondary | ICD-10-CM | POA: Insufficient documentation

## 2021-09-22 DIAGNOSIS — D841 Defects in the complement system: Secondary | ICD-10-CM | POA: Diagnosis not present

## 2021-09-22 DIAGNOSIS — E785 Hyperlipidemia, unspecified: Secondary | ICD-10-CM | POA: Diagnosis not present

## 2021-09-22 DIAGNOSIS — N1832 Chronic kidney disease, stage 3b: Secondary | ICD-10-CM | POA: Diagnosis not present

## 2021-09-22 DIAGNOSIS — J449 Chronic obstructive pulmonary disease, unspecified: Secondary | ICD-10-CM | POA: Diagnosis not present

## 2021-09-22 DIAGNOSIS — N184 Chronic kidney disease, stage 4 (severe): Secondary | ICD-10-CM | POA: Diagnosis not present

## 2021-09-22 DIAGNOSIS — N289 Disorder of kidney and ureter, unspecified: Secondary | ICD-10-CM | POA: Diagnosis not present

## 2021-09-22 LAB — CBC
HCT: 44.9 % (ref 36.0–46.0)
Hemoglobin: 14.5 g/dL (ref 12.0–15.0)
MCH: 29.5 pg (ref 26.0–34.0)
MCHC: 32.3 g/dL (ref 30.0–36.0)
MCV: 91.4 fL (ref 80.0–100.0)
Platelets: 187 10*3/uL (ref 150–400)
RBC: 4.91 MIL/uL (ref 3.87–5.11)
RDW: 13.2 % (ref 11.5–15.5)
WBC: 6.6 10*3/uL (ref 4.0–10.5)
nRBC: 0 % (ref 0.0–0.2)

## 2021-09-22 LAB — PROTIME-INR
INR: 1 (ref 0.8–1.2)
Prothrombin Time: 13 seconds (ref 11.4–15.2)

## 2021-09-22 MED ORDER — FENTANYL CITRATE (PF) 100 MCG/2ML IJ SOLN
INTRAMUSCULAR | Status: AC | PRN
  Administered 2021-09-22: 25 ug via INTRAVENOUS

## 2021-09-22 MED ORDER — LIDOCAINE HCL (PF) 1 % IJ SOLN
INTRAMUSCULAR | Status: AC
  Filled 2021-09-22: qty 30

## 2021-09-22 MED ORDER — FENTANYL CITRATE (PF) 100 MCG/2ML IJ SOLN
INTRAMUSCULAR | Status: AC
  Filled 2021-09-22: qty 2

## 2021-09-22 MED ORDER — SODIUM CHLORIDE 0.9 % IV SOLN: INTRAVENOUS | Status: DC

## 2021-09-22 MED ORDER — SODIUM CHLORIDE 0.9 % IV SOLN
INTRAVENOUS | Status: AC | PRN
  Administered 2021-09-22: 10 mL/h via INTRAVENOUS

## 2021-09-22 MED ORDER — GELATIN ABSORBABLE 12-7 MM EX MISC
CUTANEOUS | Status: AC
  Filled 2021-09-22: qty 1

## 2021-09-22 MED ORDER — MIDAZOLAM HCL 2 MG/2ML IJ SOLN
INTRAMUSCULAR | Status: AC | PRN
  Administered 2021-09-22: 1 mg via INTRAVENOUS

## 2021-09-22 MED ORDER — MIDAZOLAM HCL 2 MG/2ML IJ SOLN
INTRAMUSCULAR | Status: AC
Start: 2021-09-22 — End: 2021-09-22
  Filled 2021-09-22: qty 2

## 2021-09-22 NOTE — Consult Note (Signed)
Chief Complaint: Patient was seen in consultation today for chronic kidney disease  Referring Physician(s): Bhutani,Manpreet S  Supervising Physician: Simonne Come  Patient Status: [facility] - Out-pt  History of Present Illness: Samantha Bruce is a 71 y.o. female with history of COPD, HLD, HTN with progressive renal dysfunction.  She was referred to [facility]iology for random renal biopsy in 05/2021, however additional tissue is needed.  She returns today for repeat renal biopsy.   Samantha Bruce presents in her usual state of health.  She has been NPO.  She is accompanied by her sister today.  Denies fever, chills, nausea, vomiting, abdominal pain, shortness of breath, hematuria.   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 Authorizing Provider  acetaminophen (TYLENOL) 500 MG tablet Take 500 mg by mouth every 6 (six) hours as needed for moderate pain.   Yes [provider]  amLODipine (NORVASC) 5 MG tablet Take 5 mg by mouth daily. 08/24/21  Yes [provider]  carvedilol (COREG) 25 MG tablet Take 25 mg by mouth 2 (two) times daily. 05/20/21  Yes [provider]  cholecalciferol (VITAMIN D3) 25 MCG (1000 UNIT) tablet Take 1,000 Units by mouth daily.   Yes [provider]  losartan (COZAAR) 50 MG tablet Take 50 mg by mouth daily.   Yes [provider]  melatonin 5 MG TABS Take 5 mg by mouth at bedtime as needed (sleep).   Yes [provider]  Menthol, Topical  Analgesic, (ICY HOT EX) Apply 1 application. topically daily as needed (pain).   Yes [provider]  Multiple Minerals (CALCIUM-MAGNESIUM-ZINC) TABS Take 1 tablet by mouth daily.   Yes [provider]  Omega-3 Fatty Acids (FISH OIL) 1200 MG CAPS Take 1,200 mg by mouth daily.   Yes [provider]  sodium chloride (OCEAN) 0.65 % SOLN nasal spray Place 1 spray into both nostrils as needed for congestion (Winter time).   Yes [provider]  hydrALAZINE (APRESOLINE) 25 MG tablet Take 1 tablet (25 mg total) by mouth 2 (two) times daily. Patient not taking: Reported on 09/15/2021 05/30/21   Ellsworth Lennox, PA-C     Family History  Problem Relation Age of Onset   Hypertension Mother    Heart attack Father    Hypertension Brother    Arthritis Brother        rheumatoid   Diabetes Sister    Cancer Sister        breast   Schizophrenia Son     Social History   Socioeconomic History   Marital status: Married    Spouse name: Not on file   Number of children: Not on file   Years of education: Not on file   Highest education level: Not on file  Occupational History   Not on file  Tobacco Use   Smoking status: Light Smoker    Packs/day: 0.25    Types: Cigarettes    Start date: 04/16/1973   Smokeless tobacco:  Never   Tobacco comments:    1 pk every 3 days  Vaping Use   Vaping Use: Never used  Substance and Sexual Activity   Alcohol use: No   Drug use: No   Sexual activity: Not on file  Other Topics Concern   Not on file  Social History Narrative   Lives at home. Retired.  Education 11th grade.  4 Children.  Caffeine decaf coffee 2 cups daily.   Social Determinants of Health   Financial Resource Strain: Not on file  Food Insecurity: Not on file  Transportation Needs: Not on file  Physical Activity: Not on file  Stress: Not on file  Social Connections: Not on file     Review of Systems: A 12 point ROS discussed and pertinent positives  are indicated in the HPI above.  All other systems are negative.  Review of Systems  Constitutional:  Negative for fatigue and fever.  Respiratory:  Negative for cough and shortness of breath.   Cardiovascular:  Negative for chest pain.  Gastrointestinal:  Negative for abdominal pain.  Genitourinary:  Negative for dysuria and hematuria.  Musculoskeletal:  Negative for back pain.  Psychiatric/Behavioral:  Negative for behavioral problems and confusion.     Vital Signs: BP (!) 157/76   Pulse (!) 52   Temp (!) 97.5 F (36.4 C) (Oral)   Resp 16   Ht 5\' 7"  (1.702 m)   Wt 150 lb (68 kg)   SpO2 99%   BMI 23.49 kg/m   Physical Exam Vitals and nursing note reviewed.  Constitutional:      General: She is not in acute distress.    Appearance: Normal appearance. She is not ill-appearing.  HENT:     Mouth/Throat:     Mouth: Mucous membranes are moist.     Pharynx: Oropharynx is clear.  Cardiovascular:     Rate and Rhythm: Normal rate and regular rhythm.  Pulmonary:     Effort: Pulmonary effort is normal.     Breath sounds: Normal breath sounds.  Neurological:     General: No focal deficit present.     Mental Status: She is alert and oriented to person, place, and time. Mental status is at baseline.  Psychiatric:        Mood and Affect: Mood normal.        Behavior: Behavior normal.        Thought Content: Thought content normal.        Judgment: Judgment normal.      MD Evaluation Airway: WNL Heart: WNL Abdomen: WNL Chest/ Lungs: WNL ASA  Classification: 3 Mallampati/Airway Score: Two   Imaging: No results found.  Labs:  CBC: Recent Labs    06/16/21 0719 09/22/21 0635  WBC 6.8 6.6  HGB 13.8 14.5  HCT 41.1 44.9  PLT 166 187    COAGS: Recent Labs    06/16/21 0719 09/22/21 0635  INR 1.0 1.0    BMP: No results for input(s): "NA", "K", "CL", "CO2", "GLUCOSE", "BUN", "CALCIUM", "CREATININE", "GFRNONAA", "GFRAA" in the last 8760 hours.  Invalid  input(s): "CMP"  LIVER FUNCTION TESTS: No results for input(s): "BILITOT", "AST", "ALT", "ALKPHOS", "PROT", "ALBUMIN" in the last 8760 hours.  TUMOR MARKERS: No results for input(s): "AFPTM", "CEA", "CA199", "CHROMGRNA" in the last 8760 hours.  Assessment and Plan: Patient with past medical history of HTN, COPD presents with complaint of progressive renal failure.  IR consulted for repeat renal biopsy after additional sampling needed s/p random renal biopsy  in 05/2021 at the request of Dr. Theador Hawthorne. Case reviewed by Dr. Pascal Lux who approves patient for procedure.  Patient presents today in their usual state of health.  She has been NPO and is not currently on blood thinners. BP 157/84  Risks and benefits was discussed with the patient and/or patient's family including, but not limited to bleeding, infection, damage to adjacent structures or low yield requiring additional tests.  All of the questions were answered and there is agreement to proceed.  Consent signed and in chart.  Thank you for this interesting consult.  I greatly enjoyed meeting Samantha Bruce and look forward to participating in their care.  A copy of this report was sent to the requesting provider on this date.  Electronically Signed: Docia Barrier, PA 09/22/2021, 8:05 AM   I spent a total of   10 Minutes in face to face in clinical consultation, greater than 50% of which was counseling/coordinating care for chronic renal disease, stage IV.

## 2021-09-22 NOTE — Procedures (Signed)
Pre Procedure Dx: Chronic renal insufficiency; Previous non-diagnostic renal biopsy Post Procedural Dx: Same  Technically successful US guided biopsy of inferior pole of the left kidney.  EBL: None  No immediate complications.   Katherina Right, MD Pager #: [phone]

## 2021-09-28 ENCOUNTER — Encounter (HOSPITAL_COMMUNITY): Payer: Self-pay

## 2021-09-28 DIAGNOSIS — H25812 Combined forms of age-related cataract, left eye: Secondary | ICD-10-CM | POA: Diagnosis not present

## 2021-09-29 LAB — SURGICAL PATHOLOGY

## 2021-10-02 ENCOUNTER — Encounter (HOSPITAL_COMMUNITY): Payer: Self-pay

## 2021-10-02 ENCOUNTER — Encounter (HOSPITAL_COMMUNITY)
Admission: RE | Admit: 2021-10-02 | Discharge: 2021-10-02 | Disposition: A | Discharge status: Home / Self Care | Payer: No Typology Code available for payment source | Source: Ambulatory Visit | Attending: Ophthalmology | Admitting: Ophthalmology

## 2021-10-02 HISTORY — DX: Chronic kidney disease, unspecified: N18.9

## 2021-10-03 DIAGNOSIS — E7849 Other hyperlipidemia: Secondary | ICD-10-CM | POA: Diagnosis not present

## 2021-10-03 DIAGNOSIS — E559 Vitamin D deficiency, unspecified: Secondary | ICD-10-CM | POA: Diagnosis not present

## 2021-10-03 DIAGNOSIS — I129 Hypertensive chronic kidney disease with stage 1 through stage 4 chronic kidney disease, or unspecified chronic kidney disease: Secondary | ICD-10-CM | POA: Diagnosis not present

## 2021-10-03 DIAGNOSIS — E782 Mixed hyperlipidemia: Secondary | ICD-10-CM | POA: Diagnosis not present

## 2021-10-03 DIAGNOSIS — E876 Hypokalemia: Secondary | ICD-10-CM | POA: Diagnosis not present

## 2021-10-03 DIAGNOSIS — E785 Hyperlipidemia, unspecified: Secondary | ICD-10-CM | POA: Diagnosis not present

## 2021-10-03 DIAGNOSIS — R894 Abnormal immunological findings in specimens from other organs, systems and tissues: Secondary | ICD-10-CM | POA: Diagnosis not present

## 2021-10-03 DIAGNOSIS — R739 Hyperglycemia, unspecified: Secondary | ICD-10-CM | POA: Diagnosis not present

## 2021-10-03 DIAGNOSIS — I5032 Chronic diastolic (congestive) heart failure: Secondary | ICD-10-CM | POA: Diagnosis not present

## 2021-10-03 DIAGNOSIS — E039 Hypothyroidism, unspecified: Secondary | ICD-10-CM | POA: Diagnosis not present

## 2021-10-03 DIAGNOSIS — N184 Chronic kidney disease, stage 4 (severe): Secondary | ICD-10-CM | POA: Diagnosis not present

## 2021-10-03 DIAGNOSIS — I10 Essential (primary) hypertension: Secondary | ICD-10-CM | POA: Diagnosis not present

## 2021-10-03 DIAGNOSIS — N183 Chronic kidney disease, stage 3 unspecified: Secondary | ICD-10-CM | POA: Diagnosis not present

## 2021-10-03 NOTE — H&P (Signed)
Surgical History & Physical  Patient Name: Samantha Bruce DOB: 1950-07-28  Surgery: Cataract extraction with intraocular lens implant phacoemulsification; Left Eye  Surgeon: Fabio Pierce MD Surgery Date:  10-06-21 Pre-Op Date:  10-02-21  HPI: A 46 Yr. old female is referred by Dr Daphine Deutscher for cataract eval. 1. The patient complains of poor night vision/driving at night, which began 6 months ago. The left eye is affected. The episode is gradual. The condition's severity increased since last visit. Symptoms occur when the patient is inside and outside. This is negatively affecting the patient's quality of life and the patient is unable to function adequately in life with the current level of vision. HPI was performed by Fabio Pierce .  Medical History: Cataracts Retinal hemorrhage OS 2017 Hearing loss High Blood Pressure  Review of Systems Allergic/Immunologic Seasonal Allergies All recorded systems are negative except as noted above.  Social   Current some day smoker   Medication  Hydralazine, Indapamide, Losartan, Trazodone,   Sx/Procedures  None  Drug Allergies  Vicodin,   History & Physical: Heent: Cataract, left eye NECK: supple without bruits LUNGS: lungs clear to auscultation CV: regular rate and rhythm Abdomen: soft and non-tender Impression & Plan: Assessment: 1.  COMBINED FORMS AGE RELATED CATARACT; Both Eyes (H25.813) 2.  BLEPHARITIS; Right Upper Lid, Right Lower Lid, Left Upper Lid, Left Lower Lid (H01.001, H01.002,H01.004,H01.005) 3.  DERMATOCHALASIS, no surgery; Right Upper Lid, Left Upper Lid (H02.831, H02.834) 4.  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  Cataract accounts for the patient's decreased vision. This visual impairment is not correctable with a tolerable change in glasses or contact lenses. Cataract surgery with an implantation of a new lens should significantly improve the visual and functional status of the patient. Discussed  all risks, benefits, alternatives, and potential complications. Discussed the procedures and recovery. Patient desires to have surgery. A-scan ordered and performed today for intra-ocular lens calculations. The surgery will be performed in order to improve vision for driving, reading, and for eye examinations. Recommend phacoemulsification with intra-ocular lens. Recommend Dextenza for post-operative pain and inflammation. Left Eye worse and only. Dilates well - shugarcaine by protocol. recommend toric IOL OS Only.  2.  Recommend regular lid cleaning.  3.  Asymptomatic, recommend observation for now. Findings, prognosis and treatment options reviewed.  4.  Recommend Toric IOL OS only.

## 2021-10-05 DIAGNOSIS — I129 Hypertensive chronic kidney disease with stage 1 through stage 4 chronic kidney disease, or unspecified chronic kidney disease: Secondary | ICD-10-CM | POA: Diagnosis not present

## 2021-10-06 ENCOUNTER — Ambulatory Visit (HOSPITAL_COMMUNITY)
Admission: RE | Admit: 2021-10-06 | Discharge: 2021-10-06 | Disposition: A | Discharge status: Home / Self Care | Payer: No Typology Code available for payment source | Attending: Ophthalmology | Admitting: Ophthalmology

## 2021-10-06 ENCOUNTER — Encounter (HOSPITAL_COMMUNITY): Payer: Self-pay | Admitting: Ophthalmology

## 2021-10-06 ENCOUNTER — Ambulatory Visit (HOSPITAL_BASED_OUTPATIENT_CLINIC_OR_DEPARTMENT_OTHER): Payer: No Typology Code available for payment source | Admitting: Anesthesiology

## 2021-10-06 ENCOUNTER — Ambulatory Visit (HOSPITAL_COMMUNITY): Payer: No Typology Code available for payment source | Admitting: Anesthesiology

## 2021-10-06 ENCOUNTER — Encounter (HOSPITAL_COMMUNITY)
Admission: RE | Disposition: A | Discharge status: Home / Self Care | Payer: Self-pay | Source: Home / Self Care | Attending: Ophthalmology

## 2021-10-06 DIAGNOSIS — H0100A Unspecified blepharitis right eye, upper and lower eyelids: Secondary | ICD-10-CM | POA: Insufficient documentation

## 2021-10-06 DIAGNOSIS — H0100B Unspecified blepharitis left eye, upper and lower eyelids: Secondary | ICD-10-CM | POA: Diagnosis not present

## 2021-10-06 DIAGNOSIS — H02831 Dermatochalasis of right upper eyelid: Secondary | ICD-10-CM | POA: Insufficient documentation

## 2021-10-06 DIAGNOSIS — H02834 Dermatochalasis of left upper eyelid: Secondary | ICD-10-CM | POA: Diagnosis not present

## 2021-10-06 DIAGNOSIS — Z79899 Other long term (current) drug therapy: Secondary | ICD-10-CM | POA: Insufficient documentation

## 2021-10-06 DIAGNOSIS — H25812 Combined forms of age-related cataract, left eye: Secondary | ICD-10-CM

## 2021-10-06 DIAGNOSIS — F17200 Nicotine dependence, unspecified, uncomplicated: Secondary | ICD-10-CM | POA: Insufficient documentation

## 2021-10-06 DIAGNOSIS — J449 Chronic obstructive pulmonary disease, unspecified: Secondary | ICD-10-CM

## 2021-10-06 DIAGNOSIS — F17210 Nicotine dependence, cigarettes, uncomplicated: Secondary | ICD-10-CM | POA: Diagnosis not present

## 2021-10-06 DIAGNOSIS — H52222 Regular astigmatism, left eye: Secondary | ICD-10-CM | POA: Diagnosis not present

## 2021-10-06 DIAGNOSIS — I10 Essential (primary) hypertension: Secondary | ICD-10-CM | POA: Diagnosis not present

## 2021-10-06 DIAGNOSIS — N289 Disorder of kidney and ureter, unspecified: Secondary | ICD-10-CM

## 2021-10-06 DIAGNOSIS — H259 Unspecified age-related cataract: Secondary | ICD-10-CM

## 2021-10-06 DIAGNOSIS — H269 Unspecified cataract: Secondary | ICD-10-CM

## 2021-10-06 DIAGNOSIS — H25813 Combined forms of age-related cataract, bilateral: Secondary | ICD-10-CM | POA: Diagnosis not present

## 2021-10-06 HISTORY — PX: CATARACT EXTRACTION W/PHACO: SHX586

## 2021-10-06 SURGERY — PHACOEMULSIFICATION, CATARACT, WITH IOL INSERTION
Anesthesia: Monitor Anesthesia Care | Site: Eye | Laterality: Left

## 2021-10-06 MED ORDER — BSS IO SOLN
INTRAOCULAR | Status: DC | PRN
  Administered 2021-10-06: 15 mL via INTRAOCULAR

## 2021-10-06 MED ORDER — LIDOCAINE HCL (PF) 1 % IJ SOLN
INTRAOCULAR | Status: DC | PRN
  Administered 2021-10-06: 1 mL via OPHTHALMIC

## 2021-10-06 MED ORDER — LIDOCAINE HCL 3.5 % OP GEL
1.0000 | Freq: Once | OPHTHALMIC | Status: AC
  Administered 2021-10-06: 1 via OPHTHALMIC

## 2021-10-06 MED ORDER — STERILE WATER FOR IRRIGATION IR SOLN
Status: DC | PRN
  Administered 2021-10-06: 250 mL

## 2021-10-06 MED ORDER — NEOMYCIN-POLYMYXIN-DEXAMETH 3.5-10000-0.1 OP SUSP
OPHTHALMIC | Status: DC | PRN
  Administered 2021-10-06: 1 [drp] via OPHTHALMIC

## 2021-10-06 MED ORDER — EPINEPHRINE PF 1 MG/ML IJ SOLN
INTRAMUSCULAR | Status: AC
  Filled 2021-10-06: qty 2

## 2021-10-06 MED ORDER — SODIUM HYALURONATE 10 MG/ML IO SOLUTION
PREFILLED_SYRINGE | INTRAOCULAR | Status: DC | PRN
  Administered 2021-10-06: 0.85 mL via INTRAOCULAR

## 2021-10-06 MED ORDER — POVIDONE-IODINE 5 % OP SOLN
OPHTHALMIC | Status: DC | PRN
  Administered 2021-10-06: 1 via OPHTHALMIC

## 2021-10-06 MED ORDER — PHENYLEPHRINE HCL 2.5 % OP SOLN
1.0000 [drp] | OPHTHALMIC | Status: AC | PRN
  Administered 2021-10-06 (×3): 1 [drp] via OPHTHALMIC

## 2021-10-06 MED ORDER — TROPICAMIDE 1 % OP SOLN
1.0000 [drp] | OPHTHALMIC | Status: AC | PRN
  Administered 2021-10-06 (×3): 1 [drp] via OPHTHALMIC

## 2021-10-06 MED ORDER — SODIUM HYALURONATE 23MG/ML IO SOSY
PREFILLED_SYRINGE | INTRAOCULAR | Status: DC | PRN
  Administered 2021-10-06: 0.6 mL via INTRAOCULAR

## 2021-10-06 MED ORDER — TETRACAINE HCL 0.5 % OP SOLN
1.0000 [drp] | OPHTHALMIC | Status: AC | PRN
  Administered 2021-10-06 (×3): 1 [drp] via OPHTHALMIC

## 2021-10-06 MED ORDER — EPINEPHRINE PF 1 MG/ML IJ SOLN
INTRAOCULAR | Status: DC | PRN
  Administered 2021-10-06: 500 mL

## 2021-10-06 SURGICAL SUPPLY — 14 items
CATARACT SUITE SIGHTPATH (MISCELLANEOUS) ×2 IMPLANT
CLOTH BEACON ORANGE TIMEOUT ST (SAFETY) ×3 IMPLANT
EYE SHIELD UNIVERSAL CLEAR (GAUZE/BANDAGES/DRESSINGS) ×1 IMPLANT
FEE CATARACT SUITE SIGHTPATH (MISCELLANEOUS) ×2 IMPLANT
GLOVE BIOGEL PI IND STRL 7.0 (GLOVE) ×4 IMPLANT
GLOVE BIOGEL PI INDICATOR 7.0 (GLOVE) ×1
GLOVE SURG SS PI 7.0 STRL IVOR (GLOVE) ×1 IMPLANT
LENS IOL RAYNER 21.0 (Intraocular Lens) ×2 IMPLANT
LENS IOL RAYONE EMV 21.0 (Intraocular Lens) IMPLANT
PAD ARMBOARD 7.5X6 YLW CONV (MISCELLANEOUS) ×3 IMPLANT
SYR TB 1ML LL NO SAFETY (SYRINGE) ×3 IMPLANT
TAPE SURG TRANSPORE 1 IN (GAUZE/BANDAGES/DRESSINGS) IMPLANT
TAPE SURGICAL TRANSPORE 1 IN (GAUZE/BANDAGES/DRESSINGS) ×2
WATER STERILE IRR 250ML POUR (IV SOLUTION) ×3 IMPLANT

## 2021-10-06 NOTE — Anesthesia Preprocedure Evaluation (Signed)
Anesthesia Evaluation  Patient identified by MRN, date of birth, ID band Patient awake    Reviewed: Allergy & Precautions, NPO status , Patient's Chart, lab work & pertinent test results, reviewed documented beta blocker date and time   Airway Mallampati: II  TM Distance: >3 FB Neck ROM: Full    Dental  (+) Edentulous Upper, Edentulous Lower   Pulmonary COPD,  COPD inhaler, Current Smoker and Patient abstained from smoking.,    Pulmonary exam normal breath sounds clear to auscultation       Cardiovascular Exercise Tolerance: Good hypertension, Pt. on medications and Pt. on home beta blockers Normal cardiovascular exam Rhythm:Regular Rate:Normal     Neuro/Psych negative neurological ROS  negative psych ROS   GI/Hepatic negative GI ROS, Neg liver ROS,   Endo/Other  negative endocrine ROS  Renal/GU Renal disease  negative genitourinary   Musculoskeletal negative musculoskeletal ROS (+)   Abdominal   Peds negative pediatric ROS (+)  Hematology negative hematology ROS (+)   Anesthesia Other Findings   Reproductive/Obstetrics negative OB ROS                             Anesthesia Physical Anesthesia Plan  ASA: 2  Anesthesia Plan: MAC   Post-op Pain Management: Minimal or no pain anticipated   Induction: Intravenous  PONV Risk Score and Plan:   Airway Management Planned: Nasal Cannula and Natural Airway  Additional Equipment:   Intra-op Plan:   Post-operative Plan:   Informed Consent: I have reviewed the patients History and Physical, chart, labs and discussed the procedure including the risks, benefits and alternatives for the proposed anesthesia with the patient or authorized representative who has indicated his/her understanding and acceptance.       Plan Discussed with: CRNA and Surgeon  Anesthesia Plan Comments:         Anesthesia Quick Evaluation

## 2021-10-06 NOTE — Op Note (Signed)
Date of procedure: 10/06/21  Pre-operative diagnosis: Visually significant age-related combined cataract, Left Eye (H25.812)  Post-operative diagnosis: Visually significant age-related combined cataract, Left Eye (H25.812)  Procedure: Removal of cataract via phacoemulsification and insertion of intra-ocular lens Rayner [id number] +21.0D into the capsular bag of the Left Eye  Attending surgeon: Gerda Diss. Teneka Malmberg, MD, MA  Anesthesia: MAC, Topical Akten  Complications: None  Estimated Blood Loss: <25m (minimal)  Specimens: None  Implants: As above  Indications:  Visually significant age-related cataract, Left Eye  Procedure:  The patient was seen and identified in the pre-operative area. The operative eye was identified and dilated.  The operative eye was marked.  Topical anesthesia was administered to the operative eye.     The patient was then to the operative suite and placed in the supine position.  A timeout was performed confirming the patient, procedure to be performed, and all other relevant information.   The patient's face was prepped and draped in the usual fashion for intra-ocular surgery.  A lid speculum was placed into the operative eye and the surgical microscope moved into place and focused.  An inferotemporal paracentesis was created using a 20 gauge paracentesis blade.  Shugarcaine was injected into the anterior chamber.  Viscoelastic was injected into the anterior chamber.  A temporal clear-corneal main wound incision was created using a 2.436mmicrokeratome.  A continuous curvilinear capsulorrhexis was initiated using an irrigating cystitome and completed using capsulorrhexis forceps.  Hydrodissection and hydrodeliniation were performed.  Viscoelastic was injected into the anterior chamber.  A phacoemulsification handpiece and a chopper as a second instrument were used to remove the nucleus and epinucleus. The irrigation/aspiration handpiece was used to remove any remaining  cortical material.   The capsular bag was reinflated with viscoelastic, checked, and found to be intact.  The intraocular lens was inserted into the capsular bag.  The irrigation/aspiration handpiece was used to remove any remaining viscoelastic.  The clear corneal wound and paracentesis wounds were then hydrated and checked with Weck-Cels to be watertight.  Maxitrol was instilled in the eye. The lid-speculum was removed.  The drape was removed.  The patient's face was cleaned with a wet and dry 4x4.    A clear shield was taped over the eye. The patient was taken to the post-operative care unit in good condition, having tolerated the procedure well.  Post-Op Instructions: [facility] and will receive all other orders and instructions.

## 2021-10-06 NOTE — Anesthesia Procedure Notes (Signed)
Procedure Name: MAC Date/Time: 10/06/2021 12:05 PM  Performed by: Orlie Dakin, CRNAPre-anesthesia Checklist: Patient identified, Emergency Drugs available, Suction available and Patient being monitored Patient Re-evaluated:Patient Re-evaluated prior to induction Oxygen Delivery Method: Nasal cannula Placement Confirmation: positive ETCO2

## 2021-10-06 NOTE — Anesthesia Postprocedure Evaluation (Signed)
Anesthesia Post Note  Patient: Samantha Bruce  Procedure(s) Performed: CATARACT EXTRACTION PHACO AND INTRAOCULAR LENS PLACEMENT (IOC) (Left: Eye)  Patient location during evaluation: Phase II Anesthesia Type: MAC Level of consciousness: awake and alert and oriented Pain management: pain level controlled Vital Signs Assessment: post-procedure vital signs reviewed and stable Respiratory status: spontaneous breathing, nonlabored ventilation and respiratory function stable Cardiovascular status: stable and blood pressure returned to baseline Postop Assessment: no apparent nausea or vomiting Anesthetic complications: no   No notable events documented.   Last Vitals:  Vitals:   10/06/21 1128 10/06/21 1221  BP: (!) 191/71 (!) 149/76  Pulse: (!) 53 (!) 55  Resp: 17 18  Temp: 36.5 C 36.6 C  SpO2: 99% 100%    Last Pain:  Vitals:   10/06/21 1221  TempSrc: Oral  PainSc: 0-No pain                 Irving Bloor C Fredonia Casalino

## 2021-10-06 NOTE — Transfer of Care (Signed)
Immediate Anesthesia Transfer of Care Note  Patient: Samantha Bruce  Procedure(s) Performed: CATARACT EXTRACTION PHACO AND INTRAOCULAR LENS PLACEMENT (IOC) (Left: Eye)  Patient Location: Short Stay  Anesthesia Type:MAC  Level of Consciousness: awake, alert  and oriented  Airway & Oxygen Therapy: Patient Spontanous Breathing  Post-op Assessment: Report given to RN and Post -op Vital signs reviewed and stable  Post vital signs: Reviewed and stable  Last Vitals:  Vitals Value Taken Time  BP    Temp    Pulse    Resp    SpO2      Last Pain:  Vitals:   10/06/21 1128  TempSrc: Oral  PainSc: 0-No pain         Complications: No notable events documented.

## 2021-10-06 NOTE — Discharge Instructions (Signed)
Please discharge patient when stable, will follow up today with Dr. Euva Rundell at the [facility] [place] office immediately following discharge.  Leave shield in place until visit.  All paperwork with discharge instructions will be given at the office.  [facility] Address:  [address]  [place], [place] [address]  

## 2021-10-06 NOTE — Interval H&P Note (Signed)
History and Physical Interval Note:  10/06/2021 11:58 AM  Samantha Bruce  has presented today for surgery, with the diagnosis of combined forms age related cataract; left.  The various methods of treatment have been discussed with the patient and family. After consideration of risks, benefits and other options for treatment, the patient has consented to  Procedure(s) with comments: CATARACT EXTRACTION PHACO AND INTRAOCULAR LENS PLACEMENT (IOC) (Left) - left as a surgical intervention.  The patient's history has been reviewed, patient examined, no change in status, stable for surgery.  I have reviewed the patient's chart and labs.  Questions were answered to the patient's satisfaction.     Fabio Pierce

## 2021-10-09 ENCOUNTER — Encounter (HOSPITAL_COMMUNITY): Payer: Self-pay | Admitting: Ophthalmology

## 2021-10-12 DIAGNOSIS — I5032 Chronic diastolic (congestive) heart failure: Secondary | ICD-10-CM | POA: Diagnosis not present

## 2021-10-12 DIAGNOSIS — I129 Hypertensive chronic kidney disease with stage 1 through stage 4 chronic kidney disease, or unspecified chronic kidney disease: Secondary | ICD-10-CM | POA: Diagnosis not present

## 2021-10-12 DIAGNOSIS — R011 Cardiac murmur, unspecified: Secondary | ICD-10-CM | POA: Diagnosis not present

## 2021-10-12 DIAGNOSIS — D841 Defects in the complement system: Secondary | ICD-10-CM | POA: Diagnosis not present

## 2021-10-13 DIAGNOSIS — N184 Chronic kidney disease, stage 4 (severe): Secondary | ICD-10-CM | POA: Diagnosis not present

## 2021-10-13 DIAGNOSIS — I10 Essential (primary) hypertension: Secondary | ICD-10-CM | POA: Diagnosis not present

## 2021-10-13 DIAGNOSIS — Z6823 Body mass index (BMI) 23.0-23.9, adult: Secondary | ICD-10-CM | POA: Diagnosis not present

## 2021-10-13 DIAGNOSIS — F17210 Nicotine dependence, cigarettes, uncomplicated: Secondary | ICD-10-CM | POA: Diagnosis not present

## 2021-10-13 DIAGNOSIS — G4700 Insomnia, unspecified: Secondary | ICD-10-CM | POA: Diagnosis not present

## 2021-12-08 DIAGNOSIS — I10 Essential (primary) hypertension: Secondary | ICD-10-CM | POA: Diagnosis not present

## 2021-12-08 DIAGNOSIS — E7849 Other hyperlipidemia: Secondary | ICD-10-CM | POA: Diagnosis not present

## 2021-12-08 DIAGNOSIS — R5383 Other fatigue: Secondary | ICD-10-CM | POA: Diagnosis not present

## 2021-12-08 DIAGNOSIS — E782 Mixed hyperlipidemia: Secondary | ICD-10-CM | POA: Diagnosis not present

## 2021-12-08 DIAGNOSIS — N184 Chronic kidney disease, stage 4 (severe): Secondary | ICD-10-CM | POA: Diagnosis not present

## 2021-12-14 DIAGNOSIS — D841 Defects in the complement system: Secondary | ICD-10-CM | POA: Diagnosis not present

## 2021-12-14 DIAGNOSIS — R894 Abnormal immunological findings in specimens from other organs, systems and tissues: Secondary | ICD-10-CM | POA: Diagnosis not present

## 2021-12-14 DIAGNOSIS — I5032 Chronic diastolic (congestive) heart failure: Secondary | ICD-10-CM | POA: Diagnosis not present

## 2021-12-14 DIAGNOSIS — I129 Hypertensive chronic kidney disease with stage 1 through stage 4 chronic kidney disease, or unspecified chronic kidney disease: Secondary | ICD-10-CM | POA: Diagnosis not present

## 2021-12-14 DIAGNOSIS — N170 Acute kidney failure with tubular necrosis: Secondary | ICD-10-CM | POA: Diagnosis not present

## 2022-01-19 DIAGNOSIS — N1832 Chronic kidney disease, stage 3b: Secondary | ICD-10-CM | POA: Diagnosis not present

## 2022-01-19 DIAGNOSIS — I10 Essential (primary) hypertension: Secondary | ICD-10-CM | POA: Diagnosis not present

## 2022-01-19 DIAGNOSIS — N184 Chronic kidney disease, stage 4 (severe): Secondary | ICD-10-CM | POA: Diagnosis not present

## 2022-01-19 DIAGNOSIS — R5383 Other fatigue: Secondary | ICD-10-CM | POA: Diagnosis not present

## 2022-01-19 DIAGNOSIS — E876 Hypokalemia: Secondary | ICD-10-CM | POA: Diagnosis not present

## 2022-01-19 DIAGNOSIS — E782 Mixed hyperlipidemia: Secondary | ICD-10-CM | POA: Diagnosis not present

## 2022-01-22 DIAGNOSIS — E782 Mixed hyperlipidemia: Secondary | ICD-10-CM | POA: Diagnosis not present

## 2022-01-22 DIAGNOSIS — I10 Essential (primary) hypertension: Secondary | ICD-10-CM | POA: Diagnosis not present

## 2022-01-22 DIAGNOSIS — R5383 Other fatigue: Secondary | ICD-10-CM | POA: Diagnosis not present

## 2022-01-22 DIAGNOSIS — N184 Chronic kidney disease, stage 4 (severe): Secondary | ICD-10-CM | POA: Diagnosis not present

## 2022-01-26 DIAGNOSIS — I5032 Chronic diastolic (congestive) heart failure: Secondary | ICD-10-CM | POA: Diagnosis not present

## 2022-01-26 DIAGNOSIS — I129 Hypertensive chronic kidney disease with stage 1 through stage 4 chronic kidney disease, or unspecified chronic kidney disease: Secondary | ICD-10-CM | POA: Diagnosis not present

## 2022-01-26 DIAGNOSIS — N170 Acute kidney failure with tubular necrosis: Secondary | ICD-10-CM | POA: Diagnosis not present

## 2022-02-01 DIAGNOSIS — L821 Other seborrheic keratosis: Secondary | ICD-10-CM | POA: Diagnosis not present

## 2022-02-01 DIAGNOSIS — D485 Neoplasm of uncertain behavior of skin: Secondary | ICD-10-CM | POA: Diagnosis not present

## 2022-02-21 DIAGNOSIS — S92212A Displaced fracture of cuboid bone of left foot, initial encounter for closed fracture: Secondary | ICD-10-CM | POA: Diagnosis not present

## 2022-02-21 DIAGNOSIS — M79672 Pain in left foot: Secondary | ICD-10-CM | POA: Diagnosis not present

## 2022-03-01 DIAGNOSIS — S92212A Displaced fracture of cuboid bone of left foot, initial encounter for closed fracture: Secondary | ICD-10-CM | POA: Diagnosis not present

## 2022-03-14 DIAGNOSIS — M79671 Pain in right foot: Secondary | ICD-10-CM | POA: Diagnosis not present

## 2022-04-06 DIAGNOSIS — N184 Chronic kidney disease, stage 4 (severe): Secondary | ICD-10-CM | POA: Diagnosis not present

## 2022-04-06 DIAGNOSIS — I129 Hypertensive chronic kidney disease with stage 1 through stage 4 chronic kidney disease, or unspecified chronic kidney disease: Secondary | ICD-10-CM | POA: Diagnosis not present

## 2022-04-06 DIAGNOSIS — I5032 Chronic diastolic (congestive) heart failure: Secondary | ICD-10-CM | POA: Diagnosis not present

## 2022-04-06 DIAGNOSIS — N170 Acute kidney failure with tubular necrosis: Secondary | ICD-10-CM | POA: Diagnosis not present

## 2022-04-13 DIAGNOSIS — I5032 Chronic diastolic (congestive) heart failure: Secondary | ICD-10-CM | POA: Diagnosis not present

## 2022-04-13 DIAGNOSIS — R634 Abnormal weight loss: Secondary | ICD-10-CM | POA: Diagnosis not present

## 2022-04-13 DIAGNOSIS — I129 Hypertensive chronic kidney disease with stage 1 through stage 4 chronic kidney disease, or unspecified chronic kidney disease: Secondary | ICD-10-CM | POA: Diagnosis not present

## 2022-04-13 DIAGNOSIS — R011 Cardiac murmur, unspecified: Secondary | ICD-10-CM | POA: Diagnosis not present

## 2022-04-13 DIAGNOSIS — D841 Defects in the complement system: Secondary | ICD-10-CM | POA: Diagnosis not present

## 2022-04-20 DIAGNOSIS — Z6821 Body mass index (BMI) 21.0-21.9, adult: Secondary | ICD-10-CM | POA: Diagnosis not present

## 2022-04-20 DIAGNOSIS — R634 Abnormal weight loss: Secondary | ICD-10-CM | POA: Diagnosis not present

## 2022-05-04 DIAGNOSIS — E7849 Other hyperlipidemia: Secondary | ICD-10-CM | POA: Diagnosis not present

## 2022-05-04 DIAGNOSIS — I10 Essential (primary) hypertension: Secondary | ICD-10-CM | POA: Diagnosis not present

## 2022-05-04 DIAGNOSIS — I5032 Chronic diastolic (congestive) heart failure: Secondary | ICD-10-CM | POA: Diagnosis not present

## 2022-05-04 DIAGNOSIS — R5383 Other fatigue: Secondary | ICD-10-CM | POA: Diagnosis not present

## 2022-05-04 DIAGNOSIS — I700 Atherosclerosis of aorta: Secondary | ICD-10-CM | POA: Diagnosis not present

## 2022-05-04 DIAGNOSIS — Z122 Encounter for screening for malignant neoplasm of respiratory organs: Secondary | ICD-10-CM | POA: Diagnosis not present

## 2022-05-04 DIAGNOSIS — N184 Chronic kidney disease, stage 4 (severe): Secondary | ICD-10-CM | POA: Diagnosis not present

## 2022-05-04 DIAGNOSIS — E782 Mixed hyperlipidemia: Secondary | ICD-10-CM | POA: Diagnosis not present

## 2022-05-04 DIAGNOSIS — R970 Elevated carcinoembryonic antigen [CEA]: Secondary | ICD-10-CM | POA: Diagnosis not present

## 2022-05-04 DIAGNOSIS — R7303 Prediabetes: Secondary | ICD-10-CM | POA: Diagnosis not present

## 2022-05-04 DIAGNOSIS — E559 Vitamin D deficiency, unspecified: Secondary | ICD-10-CM | POA: Diagnosis not present

## 2022-05-04 DIAGNOSIS — J439 Emphysema, unspecified: Secondary | ICD-10-CM | POA: Diagnosis not present

## 2022-05-04 DIAGNOSIS — E876 Hypokalemia: Secondary | ICD-10-CM | POA: Diagnosis not present

## 2022-05-04 DIAGNOSIS — Z1159 Encounter for screening for other viral diseases: Secondary | ICD-10-CM | POA: Diagnosis not present

## 2022-05-04 DIAGNOSIS — F17210 Nicotine dependence, cigarettes, uncomplicated: Secondary | ICD-10-CM | POA: Diagnosis not present

## 2022-05-04 DIAGNOSIS — R894 Abnormal immunological findings in specimens from other organs, systems and tissues: Secondary | ICD-10-CM | POA: Diagnosis not present

## 2022-05-04 DIAGNOSIS — I2510 Atherosclerotic heart disease of native coronary artery without angina pectoris: Secondary | ICD-10-CM | POA: Diagnosis not present

## 2022-05-18 DIAGNOSIS — R030 Elevated blood-pressure reading, without diagnosis of hypertension: Secondary | ICD-10-CM | POA: Diagnosis not present

## 2022-05-18 DIAGNOSIS — R918 Other nonspecific abnormal finding of lung field: Secondary | ICD-10-CM | POA: Diagnosis not present

## 2022-05-18 DIAGNOSIS — I2510 Atherosclerotic heart disease of native coronary artery without angina pectoris: Secondary | ICD-10-CM | POA: Diagnosis not present

## 2022-05-18 DIAGNOSIS — R634 Abnormal weight loss: Secondary | ICD-10-CM | POA: Diagnosis not present

## 2022-05-18 DIAGNOSIS — R109 Unspecified abdominal pain: Secondary | ICD-10-CM | POA: Diagnosis not present

## 2022-05-18 DIAGNOSIS — F17210 Nicotine dependence, cigarettes, uncomplicated: Secondary | ICD-10-CM | POA: Diagnosis not present

## 2022-05-18 DIAGNOSIS — Z6820 Body mass index (BMI) 20.0-20.9, adult: Secondary | ICD-10-CM | POA: Diagnosis not present

## 2022-05-18 DIAGNOSIS — J449 Chronic obstructive pulmonary disease, unspecified: Secondary | ICD-10-CM | POA: Diagnosis not present

## 2022-05-18 DIAGNOSIS — J40 Bronchitis, not specified as acute or chronic: Secondary | ICD-10-CM | POA: Diagnosis not present

## 2022-05-18 DIAGNOSIS — R42 Dizziness and giddiness: Secondary | ICD-10-CM | POA: Diagnosis not present

## 2022-05-21 NOTE — Progress Notes (Unsigned)
Referring Provider: [facility] Primary Care Physician:  [facility] Gastroenterologist:  Dr. Abbey Chatters  No chief complaint on file.   HPI:   Samantha Bruce is a 72 y.o. female presenting today at the request of [place] for weight loss.   Colonoscopy September 2007 with two 51m sessile rectal hyperplastic polyps removed.   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     amLODipine (NORVASC) 5 MG tablet Take 5 mg by mouth daily.     carvedilol (COREG) 25 MG tablet Take 25 mg by mouth 2 (two) times daily.     cholecalciferol (VITAMIN D3) 25 MCG (1000 UNIT) tablet Take 1,000 Units by mouth daily.     losartan (COZAAR) 50 MG tablet Take 50 mg by mouth daily.     melatonin 5 MG TABS Take 5 mg by mouth at bedtime as needed (sleep).     Menthol, Topical Analgesic, (ICY HOT EX) Apply 1 application. topically daily as needed (pain).     Multiple Minerals (CALCIUM-MAGNESIUM-ZINC) TABS Take 1 tablet by mouth daily.     Omega-3 Fatty Acids (FISH OIL) 1200 MG CAPS Take 1,200 mg by mouth daily.     sodium chloride (OCEAN) 0.65 % SOLN nasal spray Place 1 spray into both nostrils as needed for congestion (Winter time).     No current  facility-administered medications for this visit.    Allergies as of 05/23/2022 - Review Complete 10/06/2021  Allergen Reaction Noted   Tylenol with codeine #3 [acetaminophen-codeine] Itching and Rash 07/25/2016   Vicodin [hydrocodone-acetaminophen] Itching and Rash 08/28/2013    Family History  Problem Relation Age of Onset   Hypertension Mother    Heart attack Father    Hypertension Brother    Arthritis Brother        rheumatoid   Diabetes Sister    Cancer Sister        breast   Schizophrenia Son     Social History   Socioeconomic History   Marital status: Married    Spouse name: Not on file   Number of children: Not on file   Years of education: Not on file   Highest education level: Not on file  Occupational History   Not on file  Tobacco Use   Smoking status: Light Smoker    Packs/day: 0.25    Types: Cigarettes    Start date: 04/16/1973   Smokeless tobacco: Never   Tobacco comments:    1 pk every 3 days  Vaping Use   Vaping Use: Never used  Substance and Sexual Activity   Alcohol use: No   Drug use: No   Sexual activity: Not on file  Other Topics  Concern   Not on file  Social History Narrative   Lives at home. Retired.  Education 11th grade.  4 Children.  Caffeine decaf coffee 2 cups daily.   Social Determinants of Health   Financial Resource Strain: Not on file  Food Insecurity: Not on file  Transportation Needs: Not on file  Physical Activity: Not on file  Stress: Not on file  Social Connections: Not on file  Intimate Partner Violence: Not on file    Review of Systems: Gen: Denies any fever, chills, fatigue, weight loss, lack of appetite.  CV: Denies chest pain, heart palpitations, peripheral edema, syncope.  Resp: Denies shortness of breath at rest or with exertion. Denies wheezing or cough.  GI: Denies dysphagia or odynophagia. Denies jaundice, hematemesis, fecal incontinence. GU : Denies urinary burning, urinary frequency, urinary  hesitancy MS: Denies joint pain, muscle weakness, cramps, or limitation of movement.  Derm: Denies rash, itching, dry skin Psych: Denies depression, anxiety, memory loss, and confusion Heme: Denies bruising, bleeding, and enlarged lymph nodes.  Physical Exam: There were no vitals taken for this visit. General:   Alert and oriented. Pleasant and cooperative. Well-nourished and well-developed.  Head:  Normocephalic and atraumatic. Eyes:  Without icterus, sclera clear and conjunctiva pink.  Ears:  Normal auditory acuity. Lungs:  Clear to auscultation bilaterally. No wheezes, rales, or rhonchi. No distress.  Heart:  S1, S2 present without murmurs appreciated.  Abdomen:  +BS, soft, non-tender and non-distended. No HSM noted. No guarding or rebound. No masses appreciated.  Rectal:  Deferred  Msk:  Symmetrical without gross deformities. Normal posture. Extremities:  Without edema. Neurologic:  Alert and  oriented x4;  grossly normal neurologically. Skin:  Intact without significant lesions or rashes. Psych:  Alert and cooperative. Normal mood and affect.    Assessment:     Plan:  ***   Aliene Altes, PA-C [facility] 05/23/2022

## 2022-05-23 ENCOUNTER — Ambulatory Visit: Payer: Medicare Other | Admitting: Gastroenterology

## 2022-05-23 ENCOUNTER — Encounter: Payer: Self-pay | Admitting: Gastroenterology

## 2022-05-23 ENCOUNTER — Telehealth: Payer: Self-pay | Admitting: *Deleted

## 2022-05-23 ENCOUNTER — Telehealth: Payer: Self-pay | Admitting: Gastroenterology

## 2022-05-23 VITALS — BP 150/74 | HR 67 | Temp 97.5°F | Wt 129.6 lb

## 2022-05-23 DIAGNOSIS — R194 Change in bowel habit: Secondary | ICD-10-CM | POA: Diagnosis not present

## 2022-05-23 DIAGNOSIS — R634 Abnormal weight loss: Secondary | ICD-10-CM | POA: Diagnosis not present

## 2022-05-23 NOTE — Telephone Encounter (Signed)
Samantha Bruce, can you request recent labs from PCP?

## 2022-05-23 NOTE — Telephone Encounter (Signed)
  Request for patient needing cleareance    05/23/22  Samantha Bruce 1950/08/17  What type of surgery is being performed? Colonoscopy/Esophagogastroduodenoscopy (EGD)  When is surgery scheduled? TBD  Pt needs to have cardiac clearance prior to being scheduled for procedures.   Name of physician performing surgery?  [facility] at [facility] Phone: [phone] Fax: [phone]  Anethesia type (none, local, MAC, general)? MAC

## 2022-05-23 NOTE — Telephone Encounter (Signed)
    Primary Cardiologist:None  Chart reviewed as part of pre-operative protocol coverage. Because of Samantha Bruce's past medical history and time since last visit, he/she will require a follow-up office visit in order to better assess preoperative cardiovascular risk.  Pre-op covering staff: - Please schedule appointment and call patient to inform them. - Please contact requesting surgeon's office via preferred method (i.e, phone, fax) to inform them of need for appointment prior to surgery.  If applicable, this message will also be routed to pharmacy pool and/or primary cardiologist for input on holding anticoagulant/antiplatelet agent as requested below so that this information is available at time of patient's appointment.   Deberah Pelton, NP  05/23/2022, 4:14 PM

## 2022-05-23 NOTE — Patient Instructions (Signed)
Keep a food journal for a couple of weeks to document how much you are taking in daily.   Drink 2 protein shakes/nutritional supplements daily.  Continue to use Imodium as needed for occasional diarrhea.  Keep plans to have a CT scan of your abdomen and pelvis on 3/22 as ordered by her primary care provider.  It would be great if these results can be faxed to our office.  Keep your plans for office visit with cardiology on 3/25.  I would like to get you scheduled for an upper endoscopy and colonoscopy in the near future to further evaluate your weight loss.  However, we will hold off on getting this scheduled until you have been cleared by cardiology.  It was a pleasure to see you today! I want to create trusting relationships with patients. If you receive a survey regarding your visit,  I greatly appreciate you taking time to fill this out on paper or through your MyChart. I value your feedback.  Aliene Altes, PA-C [facility]

## 2022-05-24 NOTE — Telephone Encounter (Signed)
Requested labs are scanned under media for review.

## 2022-05-24 NOTE — Telephone Encounter (Signed)
Reviewed.  INR elevated at 1.7 in September 2023.  In February 2023, platelets low at 70, sodium slightly low at 135, glucose elevated at 294, ALT slightly elevated at 31.  TSH, sed rate, vitamin D3, CEA all within normal limits.  HIV, hepatitis C antibody negative.  No additional recommendations at this time.  Continue with plans for CT A/P with contrast as ordered by PCP as well as office visit with cardiology.  Hopeful that we will be able to arrange EGD and colonoscopy soon pending cardiac evaluation.

## 2022-05-24 NOTE — Telephone Encounter (Signed)
Spoke to pt, informed pt of results and recommendations. Pt voiced understanding.

## 2022-05-24 NOTE — Telephone Encounter (Signed)
Patient is already schedule to see Mallipeddi on 3/25 at 10:30 am. I will update appointment notes to reflect pre-op clearance.

## 2022-05-25 DIAGNOSIS — E876 Hypokalemia: Secondary | ICD-10-CM | POA: Diagnosis not present

## 2022-05-25 DIAGNOSIS — N184 Chronic kidney disease, stage 4 (severe): Secondary | ICD-10-CM | POA: Diagnosis not present

## 2022-06-08 ENCOUNTER — Encounter: Payer: Self-pay | Admitting: *Deleted

## 2022-06-08 DIAGNOSIS — R109 Unspecified abdominal pain: Secondary | ICD-10-CM | POA: Diagnosis not present

## 2022-06-08 DIAGNOSIS — I700 Atherosclerosis of aorta: Secondary | ICD-10-CM | POA: Diagnosis not present

## 2022-06-08 DIAGNOSIS — K8020 Calculus of gallbladder without cholecystitis without obstruction: Secondary | ICD-10-CM | POA: Diagnosis not present

## 2022-06-11 ENCOUNTER — Ambulatory Visit: Payer: Medicare Other | Attending: Internal Medicine | Admitting: Internal Medicine

## 2022-06-11 ENCOUNTER — Encounter: Payer: Self-pay | Admitting: Internal Medicine

## 2022-06-11 ENCOUNTER — Telehealth: Payer: Self-pay | Admitting: *Deleted

## 2022-06-11 VITALS — BP 114/58 | HR 59 | Ht 67.0 in | Wt 132.2 lb

## 2022-06-11 DIAGNOSIS — Z87891 Personal history of nicotine dependence: Secondary | ICD-10-CM

## 2022-06-11 DIAGNOSIS — Z01818 Encounter for other preprocedural examination: Secondary | ICD-10-CM | POA: Diagnosis not present

## 2022-06-11 DIAGNOSIS — Z01810 Encounter for preprocedural cardiovascular examination: Secondary | ICD-10-CM

## 2022-06-11 DIAGNOSIS — E7849 Other hyperlipidemia: Secondary | ICD-10-CM | POA: Diagnosis not present

## 2022-06-11 DIAGNOSIS — I10 Essential (primary) hypertension: Secondary | ICD-10-CM | POA: Diagnosis not present

## 2022-06-11 DIAGNOSIS — Z136 Encounter for screening for cardiovascular disorders: Secondary | ICD-10-CM | POA: Diagnosis not present

## 2022-06-11 DIAGNOSIS — E785 Hyperlipidemia, unspecified: Secondary | ICD-10-CM | POA: Insufficient documentation

## 2022-06-11 DIAGNOSIS — I2510 Atherosclerotic heart disease of native coronary artery without angina pectoris: Secondary | ICD-10-CM

## 2022-06-11 DIAGNOSIS — Z720 Tobacco use: Secondary | ICD-10-CM | POA: Insufficient documentation

## 2022-06-11 MED ORDER — ROSUVASTATIN CALCIUM 10 MG PO TABS: 10.0000 mg | ORAL_TABLET | Freq: Every day | ORAL | 3 refills | Status: DC

## 2022-06-11 MED ORDER — ASPIRIN 81 MG PO TBEC: 81.0000 mg | DELAYED_RELEASE_TABLET | Freq: Every day | ORAL | 3 refills | Status: DC

## 2022-06-11 NOTE — Progress Notes (Signed)
Cardiology Office Note  Date: 06/11/2022   ID: Samantha Bruce, DOB 30-Jun-1950, MRN [id number]  PCP:  [facility]  Cardiologist:  Chalmers Guest, MD Electrophysiologist:  None   Reason for Office Visit: Preop clearance for EGD and colonoscopy at the request of Dr. Jimmye Norman   History of Present Illness: Samantha Bruce is a 72 y.o. female known to have HTN, HLD, nicotine abuse was referred to cardiology clinic for preop clearance for EGD and colonoscopy due to imaging evidence of severe LM and coronary artery calcifications. Accompanied by sister.  Patient was recently seen by GI due to unintentional weight loss but after keeping a log of diet/calories daily, she gained weight. She was scheduled for EGD and colonoscopy for workup of unintentional weight loss. But due to imaging evidence of severe LM and coronary artery calcifications, she was referred to cardiology for preop clearance. Patient never had any prior MI/PCI/CABG or ischemic evaluation. Denies having any angina, DOE, dizziness, lightheadedness, syncope, palpitations and LE swelling. No claudication.  Currently smokes cigarettes 3 cigarettes/day. Lives with her daughter and takes care of her granddaughter. She plays with her granddaughter, role-play, running around her. She says she has been physically active all her life.  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     carvedilol (COREG) 25 MG tablet Take 25 mg by mouth 2 (two) times daily.     cholecalciferol (VITAMIN D3) 25 MCG (1000 UNIT) tablet Take 1,000 Units by mouth daily.     cyanocobalamin (VITAMIN B12) 1000 MCG tablet Take 1,000 mcg by mouth daily.     indapamide (LOZOL) 2.5 MG tablet Take 5 mg by mouth daily.     melatonin 5 MG TABS Take 5 mg by mouth at bedtime as needed (sleep).     Menthol, Topical Analgesic, (ICY HOT EX) Apply 1 application. topically daily as needed (pain).     Multiple Minerals (CALCIUM-MAGNESIUM-ZINC) TABS Take 1 tablet by mouth daily.     NIFEdipine (PROCARDIA XL/NIFEDICAL XL) 60 MG 24 hr tablet Take 60 mg by mouth daily.     Omega-3 Fatty Acids (FISH OIL) 1200 MG CAPS Take 1,200 mg by mouth daily.     No current facility-administered medications for this visit.   Allergies:  Tylenol with codeine #3 [acetaminophen-codeine] and Vicodin [hydrocodone-acetaminophen]   Social History: The patient  reports that she has been smoking cigarettes. She started smoking about 49 years ago. She has been smoking an average of .25 packs per day. She has never used smokeless tobacco. She reports that she does not drink alcohol and does not use drugs.   Family History: The patient's family history includes Arthritis in her brother; Cancer in her  sister; Diabetes in her sister; Heart attack in her father; Hypertension in her brother and mother; Schizophrenia in her son.   ROS:  Please see the history of present illness. Otherwise, complete review of systems is positive for none.  All other systems are reviewed and negative.   Physical Exam: VS:  BP (!) 114/58   Pulse (!) 59   Ht 5\' 7"  (1.702 m)   Wt 132 lb 3.2 oz (60 kg)   SpO2 98%   BMI 20.71 kg/m , BMI Body mass index is 20.71 kg/m.  Wt Readings  from Last 3 Encounters:  06/11/22 132 lb 3.2 oz (60 kg)  05/23/22 129 lb 9.6 oz (58.8 kg)  10/06/21 150 lb (68 kg)    General: Patient appears comfortable at rest. HEENT: Conjunctiva and lids normal, oropharynx clear with moist mucosa. Neck: Supple, no elevated JVP or carotid bruits, no thyromegaly. Lungs: Clear to auscultation, nonlabored breathing at rest. Cardiac: Regular rate and rhythm, no S3 or significant systolic murmur, no pericardial rub. Abdomen: Soft, nontender, no hepatomegaly, bowel sounds present, no guarding or rebound. Extremities: No pitting edema, distal pulses 2+. Skin: Warm and dry. Musculoskeletal: No kyphosis. Neuropsychiatric: Alert and oriented x3, affect grossly appropriate.  ECG:  An ECG dated 06/11/2022 was personally reviewed today and demonstrated:  Sinus pericardia, HR 58 and no ST changes  Recent Labwork: 09/22/2021: Hemoglobin 14.5; Platelets 187  No results found for: "CHOL", "TRIG", "HDL", "CHOLHDL", "VLDL", "LDLCALC", "LDLDIRECT"  Other Studies Reviewed Today: Echocardiogram in 07/2021 LVEF normal Mild MR  Assessment and Plan: Patient is a 72 year old F known to have HTN, HLD was referred to cardiology clinic for preop clearance of EGD and colonoscopy.  # Preop cardiac risk stratification for EGD and colonoscopy -Patient denied any angina or DOE. She has been physically active all her life with no symptoms. EKG shows sinus bradycardia, HR 58 with no ST-T changes. Echocardiogram from 07/2021 showed normal LVEF and mild MR. Patient has no ACS, ADHF or severe valvular stenosis/regurgitation. Patient is at a low risk for any perioperative cardiac complications. No further cardiac testing is indicated prior to proceeding with the planned procedure.  # Imaging evidence of severe LM and coronary artery calcifications -Patient denied any symptoms of angina or DOE ever. In the absence of symptoms, will defer any further cardiac testing at this time. I educated  the patient about the symptoms of CAD including angina, DOE, severe fatigue and decrease in stamina levels. -Start aspirin 81 mg once daily (can be started after the procedure)  # Hyperlipidemia, not at goal -I personally reviewed the lipid panel from the PCP, LDL 169. Start rosuvastatin 10 mg nightly (can be started after the procedure). Patient is educated about the side effect of myalgias and instructed to stop the medication if she experiences the symptoms.  # HTN, controlled -Continue carvedilol 25 mg twice daily and nifedipine 60 mg once daily.  HTN management per PCP.  # Nicotine abuse -Smoking cessation counseling provided, currently smokes 3 cigarettes/day. Smoking cessation instruction/counseling given:  counseled patient on the dangers of tobacco use, advised patient to stop smoking, and reviewed strategies to maximize success.   I have spent a total of 45 minutes with patient reviewing chart, EKGs, labs and examining patient as well as establishing an assessment and plan that was discussed with the patient.  > 50% of time was spent in direct patient care.      Medication Adjustments/Labs and Tests Ordered: Current medicines are reviewed at length  with the patient today.  Concerns regarding medicines are outlined above.   Tests Ordered: Orders Placed This Encounter  Procedures   EKG 12-Lead   Medication Changes: Meds ordered this encounter  Medications   aspirin EC 81 MG tablet    Sig: Take 1 tablet (81 mg total) by mouth daily. Swallow whole.    Dispense:  90 tablet    Refill:  3    06/11/2022 NEW   rosuvastatin (CRESTOR) 10 MG tablet    Sig: Take 1 tablet (10 mg total) by mouth at bedtime.    Dispense:  90 tablet    Refill:  3    06/11/2022 NEW      Disposition:  Follow up  one year  Signed Danayah Smyre Fidel Levy, MD, 06/11/2022 10:31 AM    [facility] [place], [place], [place] [address]

## 2022-06-11 NOTE — Telephone Encounter (Signed)
Patient informed and verbalized understanding of plan. 

## 2022-06-11 NOTE — Telephone Encounter (Signed)
Per Mallipeddi-Looks like she already got ultrasound aorta (Medicare) in 2015. Can you please cancel it. I am not able to cancel it from my end.

## 2022-06-11 NOTE — Patient Instructions (Addendum)
Medication Instructions:  Your physician has recommended you make the following change in your medication:  Start aspirin 81 mg daily Start rosuvastatin 10 mg daily at bedtime Continue other medications the same  Labwork: none  Testing/Procedures: Your physician has requested that you have an abdominal aorta duplex. During this test, an ultrasound is used to evaluate the aorta. Allow 30 minutes for this exam. Do not eat after midnight the day before and avoid carbonated beverages  Follow-Up: Your physician recommends that you schedule a follow-up appointment in: 1 year. You will receive a reminder call in the mail in about 10 months reminding you to call and schedule your appointment. If you don't receive this call, please contact our office.  Any Other Special Instructions Will Be Listed Below (If Applicable).  If you need a refill on your cardiac medications before your next appointment, please call your pharmacy.

## 2022-06-20 ENCOUNTER — Telehealth: Payer: Self-pay | Admitting: *Deleted

## 2022-06-20 NOTE — Telephone Encounter (Signed)
Reviewed office visit with cardiology.  No additional testing was recommended.  She was cleared for procedures.  Please proceed with scheduling.

## 2022-06-20 NOTE — Telephone Encounter (Signed)
Pt's sister Jackelyn Poling called wanting to know about clearance for pt to have procedure. Pt seen cardiology on 06/11/22. Please advise. Thank you

## 2022-06-22 ENCOUNTER — Encounter: Payer: Self-pay | Admitting: *Deleted

## 2022-06-22 ENCOUNTER — Other Ambulatory Visit: Payer: Self-pay | Admitting: *Deleted

## 2022-06-22 MED ORDER — PEG 3350-KCL-NA BICARB-NACL 420 G PO SOLR: 4000.0000 mL | Freq: Once | ORAL | 0 refills | Status: AC

## 2022-06-22 NOTE — Telephone Encounter (Signed)
Pt has been scheduled for 07/27/22. Instructions mailed and prep sent to the pharmacy.     [employer] PA: Notification or Prior Authorization is not required for the requested services You are not required to submit a notification/prior authorization based on the information provided. The number above acknowledges your inquiry and our response. Please write this number down and refer to it for future inquiries. If you still wish to submit your request for review, please select the Continue with Submission button below. Decision ID #: [id number]

## 2022-06-25 ENCOUNTER — Encounter: Payer: Self-pay | Admitting: *Deleted

## 2022-06-29 DIAGNOSIS — R918 Other nonspecific abnormal finding of lung field: Secondary | ICD-10-CM | POA: Diagnosis not present

## 2022-06-29 DIAGNOSIS — R030 Elevated blood-pressure reading, without diagnosis of hypertension: Secondary | ICD-10-CM | POA: Diagnosis not present

## 2022-06-29 DIAGNOSIS — I2510 Atherosclerotic heart disease of native coronary artery without angina pectoris: Secondary | ICD-10-CM | POA: Diagnosis not present

## 2022-06-29 DIAGNOSIS — J449 Chronic obstructive pulmonary disease, unspecified: Secondary | ICD-10-CM | POA: Diagnosis not present

## 2022-06-29 DIAGNOSIS — R109 Unspecified abdominal pain: Secondary | ICD-10-CM | POA: Diagnosis not present

## 2022-06-29 DIAGNOSIS — R634 Abnormal weight loss: Secondary | ICD-10-CM | POA: Diagnosis not present

## 2022-06-29 DIAGNOSIS — Z6821 Body mass index (BMI) 21.0-21.9, adult: Secondary | ICD-10-CM | POA: Diagnosis not present

## 2022-06-29 DIAGNOSIS — F17210 Nicotine dependence, cigarettes, uncomplicated: Secondary | ICD-10-CM | POA: Diagnosis not present

## 2022-06-29 DIAGNOSIS — J40 Bronchitis, not specified as acute or chronic: Secondary | ICD-10-CM | POA: Diagnosis not present

## 2022-06-29 DIAGNOSIS — R42 Dizziness and giddiness: Secondary | ICD-10-CM | POA: Diagnosis not present

## 2022-07-11 NOTE — Progress Notes (Signed)
LAETITIA SCHNEPF, female    DOB: 20-Aug-1950    MRN: [id number]   Brief patient profile:  54  yowf  active smoker  referred to pulmonary clinic in [place]  07/13/2022 by Mitzi Hansen  for abn LDSCT 05/04/22 mild centrilobular emphysema. Mild bilateral patchy ground-glass opacities but no Nodules.    History of Present Illness  07/13/2022  Pulmonary/ 1st office eval/ [facility] / [place] [facility]  Chief Complaint  Patient presents with   Consult    Pt consult for abnormal CT  Dyspnea:  no cp or doe playing with 70 y old in back yard Cough: none  Sleep: no resp cc / flat bed one pillow on side  SABA use: none   No obvious day to day or daytime pattern/variability or assoc excess/ purulent sputum or mucus plugs or hemoptysis or cp or chest tightness, subjective wheeze or overt sinus or hb symptoms.   Sleeping  without nocturnal  or early am exacerbation  of respiratory  c/o's or need for noct saba. Also denies any obvious fluctuation of symptoms with weather or environmental changes or other aggravating or alleviating factors except as outlined above   No unusual exposure hx or h/o childhood pna/ asthma or knowledge of premature birth.  Current Allergies, Complete Past Medical History, Past Surgical History, Family History, and Social History were reviewed in [employer] record.  ROS  The following are not active complaints unless bolded Hoarseness, sore throat, dysphagia, dental problems, itching, sneezing,  nasal congestion or discharge of excess mucus or purulent secretions, ear ache,   fever, chills, sweats, unintended wt loss or wt gain, classically pleuritic or exertional cp,  orthopnea pnd or arm/hand swelling  or leg swelling L post surgery x remotely , presyncope, palpitations, abdominal pain, anorexia, nausea, vomiting, diarrhea  or change in bowel habits or change in bladder habits, change in stools or change in urine, dysuria, hematuria,  rash,  arthralgias, visual complaints, headache, numbness, weakness or ataxia or problems with walking or coordination,  change in mood or  memory.              Past Medical History:  Diagnosis Date   Chronic kidney disease    COPD (chronic obstructive pulmonary disease) (HCC)    Hyperlipidemia    Hypertension    Vertigo    Vitamin D deficiency     Outpatient Medications Prior to Visit  Medication Sig Dispense Refill   acetaminophen (TYLENOL) 500 MG tablet Take 500 mg by mouth every 6 (six) hours as needed for moderate pain.     aspirin EC 81 MG tablet Take 1 tablet (81 mg total) by mouth daily. Swallow whole. 90 tablet 3   carvedilol (COREG) 25 MG tablet Take 25 mg by mouth 2 (two) times daily.     cholecalciferol (VITAMIN D3) 25 MCG (1000 UNIT) tablet Take 1,000 Units by mouth daily.     cyanocobalamin (VITAMIN B12) 1000 MCG tablet Take 1,000 mcg by mouth daily.     indapamide (LOZOL) 2.5 MG tablet Take 5 mg by mouth daily.     melatonin 5 MG TABS Take 5 mg by mouth at bedtime as needed (sleep).     Menthol, Topical Analgesic, (ICY HOT EX) Apply 1 application. topically daily as needed (pain).     Multiple Minerals (CALCIUM-MAGNESIUM-ZINC) TABS Take 1 tablet by mouth daily.     NIFEdipine (PROCARDIA XL/NIFEDICAL XL) 60 MG 24 hr tablet Take 60 mg by mouth daily.  Omega-3 Fatty Acids (FISH OIL) 1200 MG CAPS Take 1,200 mg by mouth daily.     rosuvastatin (CRESTOR) 10 MG tablet Take 1 tablet (10 mg total) by mouth at bedtime. 90 tablet 3   No facility-administered medications prior to visit.     Objective:     BP 114/61   Pulse (!) 59   Ht 5\' 6"  (1.676 m)   Wt 134 lb 6.4 oz (61 kg)   SpO2 97%   BMI 21.69 kg/m   SpO2: 97 %  RA  HEENT : Oropharynx  clear    NECK :  without  apparent JVD/ palpable Nodes/TM    LUNGS: no acc muscle use,  Min barrel  contour chest wall with bilateral  slightly decreased bs s audible wheeze and  without cough on insp or exp maneuvers and min   Hyperresonant  to  percussion bilaterally    CV:  RRR  no s3 or murmur or increase in P2, and no edema   ABD:  soft and nontender with pos end  insp Hoover's  in the supine position.  No bruits or organomegaly appreciated   MS:  Nl gait/ ext warm without deformities Or obvious joint restrictions  calf tenderness, cyanosis or clubbing     SKIN: warm and dry without lesions    NEURO:  alert, approp, nl sensorium with  no motor or cerebellar deficits apparent.          Assessment   Centrilobular emphysema (HCC) LDSCT 05/04/22 mild centrilobular emphysema Mild bilateral patchy ground-glass opacities but no Nodules. - 07/13/2022   Walked on RA  x  3  lap(s) =  approx 450  ft  @ moderate pace, stopped due to end of study with lowest 02 sats 97% and no sob  - Labs ordered 07/13/2022  :  allergy profile   alpha one AT phenotype  In an active longterm smoker the above findings are not diagnostic of copd  nor suggestive of lung ca or ILD though she is at risk of all 3.  If she does have copd it is relatively mild.     I reviewed the Fletcher curve with the patient that basically indicates  if you quit smoking when your best day FEV1 is still well preserved (as is probably  the case here)  it is highly unlikely you will progress to severe disease and informed the patient there was  no medication on the market that has proven to alter the curve/ its downward trajectory  or the likelihood of progression of their disease(unlike other chronic medical conditions such as atheroclerosis where we do think we can change the natural hx with risk reducing meds)    Therefore stopping smoking and maintaining abstinence are  the most important aspects of her care, not choice of inhalers or for that matter, doctors.   Treatment other than smoking cessation  is entirely directed by severity of symptoms and focused also on reducing exacerbations, not attempting to change the natural history of the disease.  Since she  has no limiting doe or tendency to aecopd I don't rec anything but smoking cessation and continue with annual LDSCT - return if any evolving changes radiographically or clinically.  Cigarette smoker Counseled re importance of smoking cessation but did not meet time criteria for separate billing    Pt declined pfts - f/u can be prn   Each maintenance medication was reviewed in detail including emphasizing most importantly the difference between maintenance and  prns and under what circumstances the prns are to be triggered using an action plan format where appropriate.  Total time for H and P, chart review, counseling,  directly observing portions of ambulatory 02 saturation study/ and generating customized AVS unique to this office visit / same day charting = 40 min                    Sandrea Hughs, MD 07/13/2022

## 2022-07-13 ENCOUNTER — Ambulatory Visit: Payer: Medicare Other | Admitting: Internal Medicine

## 2022-07-13 ENCOUNTER — Institutional Professional Consult (permissible substitution): Payer: Medicare Other | Admitting: Internal Medicine

## 2022-07-13 ENCOUNTER — Encounter: Payer: Self-pay | Admitting: Internal Medicine

## 2022-07-13 VITALS — BP 114/61 | HR 59 | Ht 66.0 in | Wt 134.4 lb

## 2022-07-13 DIAGNOSIS — E782 Mixed hyperlipidemia: Secondary | ICD-10-CM | POA: Diagnosis not present

## 2022-07-13 DIAGNOSIS — E876 Hypokalemia: Secondary | ICD-10-CM | POA: Diagnosis not present

## 2022-07-13 DIAGNOSIS — J432 Centrilobular emphysema: Secondary | ICD-10-CM | POA: Diagnosis not present

## 2022-07-13 DIAGNOSIS — I10 Essential (primary) hypertension: Secondary | ICD-10-CM | POA: Diagnosis not present

## 2022-07-13 DIAGNOSIS — N184 Chronic kidney disease, stage 4 (severe): Secondary | ICD-10-CM | POA: Diagnosis not present

## 2022-07-13 DIAGNOSIS — F17210 Nicotine dependence, cigarettes, uncomplicated: Secondary | ICD-10-CM | POA: Insufficient documentation

## 2022-07-13 DIAGNOSIS — R5383 Other fatigue: Secondary | ICD-10-CM | POA: Diagnosis not present

## 2022-07-13 DIAGNOSIS — E7849 Other hyperlipidemia: Secondary | ICD-10-CM | POA: Diagnosis not present

## 2022-07-13 NOTE — Assessment & Plan Note (Signed)
LDSCT 05/04/22 mild centrilobular emphysema Mild bilateral patchy ground-glass opacities but no Nodules. - 07/13/2022   Walked on RA  x  3  lap(s) =  approx 450  ft  @ moderate pace, stopped due to end of study with lowest 02 sats 97% and no sob  - Labs ordered 07/13/2022  :  allergy profile   alpha one AT phenotype  In an active longterm smoker the above findings are not diagnostic of copd  nor suggestive of lung ca or ILD though she is at risk of all 3.  If she does have copd it is relatively mild.     I reviewed the Fletcher curve with the patient that basically indicates  if you quit smoking when your best day FEV1 is still well preserved (as is probably  the case here)  it is highly unlikely you will progress to severe disease and informed the patient there was  no medication on the market that has proven to alter the curve/ its downward trajectory  or the likelihood of progression of their disease(unlike other chronic medical conditions such as atheroclerosis where we do think we can change the natural hx with risk reducing meds)    Therefore stopping smoking and maintaining abstinence are  the most important aspects of her care, not choice of inhalers or for that matter, doctors.   Treatment other than smoking cessation  is entirely directed by severity of symptoms and focused also on reducing exacerbations, not attempting to change the natural history of the disease.  Since she has no limiting doe or tendency to aecopd I don't rec anything but smoking cessation and continue with annual LDSCT - return if any evolving changes radiographically or clinically.

## 2022-07-13 NOTE — Assessment & Plan Note (Signed)
Counseled re importance of smoking cessation but did not meet time criteria for separate billing    Pt declined pfts - f/u can be prn   Each maintenance medication was reviewed in detail including emphasizing most importantly the difference between maintenance and prns and under what circumstances the prns are to be triggered using an action plan format where appropriate.  Total time for H and P, chart review, counseling,  directly observing portions of ambulatory 02 saturation study/ and generating customized AVS unique to this office visit / same day charting = 40 min

## 2022-07-13 NOTE — Patient Instructions (Addendum)
No evidence of lung cancer, keep your annual CT chest scans   If you change your mind about having your lung function, give this office a call and we can arrange it.   Please remember to go to the lab department   for your tests - we will call you with the results when they are available.      Follow up here is as needed for worse breathing with exertion or unexplained cough or chest pain with breathing

## 2022-07-14 LAB — SEDIMENTATION RATE: Sed Rate: 7 mm/hr (ref 0–40)

## 2022-07-20 DIAGNOSIS — N184 Chronic kidney disease, stage 4 (severe): Secondary | ICD-10-CM | POA: Diagnosis not present

## 2022-07-20 DIAGNOSIS — I2510 Atherosclerotic heart disease of native coronary artery without angina pectoris: Secondary | ICD-10-CM | POA: Diagnosis not present

## 2022-07-20 DIAGNOSIS — J449 Chronic obstructive pulmonary disease, unspecified: Secondary | ICD-10-CM | POA: Diagnosis not present

## 2022-07-20 LAB — CBC WITH DIFFERENTIAL/PLATELET
Basophils Absolute: 0 10*3/uL (ref 0.0–0.2)
Basos: 1 %
EOS (ABSOLUTE): 0 10*3/uL (ref 0.0–0.4)
Eos: 0 %
Hematocrit: 41.9 % (ref 34.0–46.6)
Hemoglobin: 13.6 g/dL (ref 11.1–15.9)
Immature Grans (Abs): 0 10*3/uL (ref 0.0–0.1)
Immature Granulocytes: 0 %
Lymphocytes Absolute: 1.7 10*3/uL (ref 0.7–3.1)
Lymphs: 24 %
MCH: 29.1 pg (ref 26.6–33.0)
MCHC: 32.5 g/dL (ref 31.5–35.7)
MCV: 90 fL (ref 79–97)
Monocytes Absolute: 0.6 10*3/uL (ref 0.1–0.9)
Monocytes: 8 %
Neutrophils Absolute: 4.8 10*3/uL (ref 1.4–7.0)
Neutrophils: 67 %
Platelets: 234 10*3/uL (ref 150–450)
RBC: 4.67 x10E6/uL (ref 3.77–5.28)
RDW: 13.7 % (ref 11.7–15.4)
WBC: 7.1 10*3/uL (ref 3.4–10.8)

## 2022-07-20 LAB — ALPHA-1-ANTITRYPSIN PHENOTYP: A-1 Antitrypsin: 191 mg/dL — ABNORMAL HIGH (ref 101–187)

## 2022-07-21 DIAGNOSIS — Z720 Tobacco use: Secondary | ICD-10-CM | POA: Diagnosis not present

## 2022-07-21 DIAGNOSIS — J432 Centrilobular emphysema: Secondary | ICD-10-CM | POA: Diagnosis not present

## 2022-07-21 DIAGNOSIS — I5032 Chronic diastolic (congestive) heart failure: Secondary | ICD-10-CM | POA: Diagnosis not present

## 2022-07-21 DIAGNOSIS — I129 Hypertensive chronic kidney disease with stage 1 through stage 4 chronic kidney disease, or unspecified chronic kidney disease: Secondary | ICD-10-CM | POA: Diagnosis not present

## 2022-07-24 ENCOUNTER — Other Ambulatory Visit: Payer: Self-pay

## 2022-07-24 ENCOUNTER — Encounter (HOSPITAL_COMMUNITY): Payer: Self-pay

## 2022-07-25 ENCOUNTER — Other Ambulatory Visit (HOSPITAL_COMMUNITY): Payer: Medicare Other

## 2022-07-25 ENCOUNTER — Encounter (HOSPITAL_COMMUNITY)
Admission: RE | Admit: 2022-07-25 | Discharge: 2022-07-25 | Disposition: A | Discharge status: Home / Self Care | Payer: Medicare Other | Source: Ambulatory Visit | Attending: Internal Medicine | Admitting: Internal Medicine

## 2022-07-25 ENCOUNTER — Encounter (HOSPITAL_COMMUNITY): Payer: Self-pay

## 2022-07-25 HISTORY — DX: Anxiety disorder, unspecified: F41.9

## 2022-07-25 NOTE — Patient Instructions (Signed)
Samantha Bruce  07/25/2022     @PREFPERIOPPHARMACY @   Your procedure is scheduled on  07/27/2022.   Report to [facility] at  0845  A.M.   Call this number if you have problems the morning of surgery:  [phone]  If you experience any cold or flu symptoms such as cough, fever, chills, shortness of breath, etc. between now and your scheduled surgery, please notify us at the above number.   Remember:  Follow the diet and prep instructions given to you by the office.     Take these medicines the morning of surgery with A SIP OF WATER                              carvedilol, procardia.     Do not wear jewelry, make-up or nail polish.  Do not wear lotions, powders, or perfumes, or deodorant.  Do not shave 48 hours prior to surgery.  Men may shave face and neck.  Do not bring valuables to the hospital.  [facility] is not responsible for any belongings or valuables.  Contacts, dentures or bridgework may not be worn into surgery.  Leave your suitcase in the car.  After surgery it may be brought to your room.  For patients admitted to the hospital, discharge time will be determined by your treatment team.  Patients discharged the day of surgery will not be allowed to drive home and must have someone with them for 24 hours.    Special instructions:   DO NOT smoke tobacco or vape for 24 hours before your procedure.  Please read over the following fact sheets that you were given. Anesthesia Post-op Instructions and Care and Recovery After Surgery      Upper Endoscopy, Adult, Care After After the procedure, it is common to have a sore throat. It is also common to have: Mild stomach pain or discomfort. Bloating. Nausea. Follow these instructions at home: The instructions below may help you care for yourself at home. Your health care provider may give you more instructions. If you have questions, ask your health care provider. If you were given a sedative  during the procedure, it can affect you for several hours. Do not drive or operate machinery until your health care provider says that it is safe. If you will be going home right after the procedure, plan to have a responsible adult: Take you home from the hospital or clinic. You will not be allowed to drive. Care for you for the time you are told. Follow instructions from your health care provider about what you may eat and drink. Return to your normal activities as told by your health care provider. Ask your health care provider what activities are safe for you. Take over-the-counter and prescription medicines only as told by your health care provider. Contact a health care provider if you: Have a sore throat that lasts longer than one day. Have trouble swallowing. Have a fever. Get help right away if you: Vomit blood or your vomit looks like coffee grounds. Have bloody, black, or tarry stools. Have a very bad sore throat or you cannot swallow. Have difficulty breathing or very bad pain in your chest or abdomen. These symptoms may be an emergency. Get help right away. Call 911. Do not wait to see if the symptoms will go away. Do not drive yourself to the hospital. Summary After  the procedure, it is common to have a sore throat, mild stomach discomfort, bloating, and nausea. If you were given a sedative during the procedure, it can affect you for several hours. Do not drive until your health care provider says that it is safe. Follow instructions from your health care provider about what you may eat and drink. Return to your normal activities as told by your health care provider. This information is not intended to replace advice given to you by your health care provider. Make sure you discuss any questions you have with your health care provider. Document Revised: 06/14/2021 Document Reviewed: 06/14/2021 Elsevier Patient Education  2023 Elsevier Inc. Colonoscopy, Adult, Care After The  following information offers guidance on how to care for yourself after your procedure. Your health care provider may also give you more specific instructions. If you have problems or questions, contact your health care provider. What can I expect after the procedure? After the procedure, it is common to have: A small amount of blood in your stool for 24 hours after the procedure. Some gas. Mild cramping or bloating of your abdomen. Follow these instructions at home: Eating and drinking  Drink enough fluid to keep your urine pale yellow. Follow instructions from your health care provider about eating or drinking restrictions. Resume your normal diet as told by your health care provider. Avoid heavy or fried foods that are hard to digest. Activity Rest as told by your health care provider. Avoid sitting for a long time without moving. Get up to take short walks every 1-2 hours. This is important to improve blood flow and breathing. Ask for help if you feel weak or unsteady. Return to your normal activities as told by your health care provider. Ask your health care provider what activities are safe for you. Managing cramping and bloating  Try walking around when you have cramps or feel bloated. If directed, apply heat to your abdomen as told by your health care provider. Use the heat source that your health care provider recommends, such as a moist heat pack or a heating pad. Place a towel between your skin and the heat source. Leave the heat on for 20-30 minutes. Remove the heat if your skin turns bright red. This is especially important if you are unable to feel pain, heat, or cold. You have a greater risk of getting burned. General instructions If you were given a sedative during the procedure, it can affect you for several hours. Do not drive or operate machinery until your health care provider says that it is safe. For the first 24 hours after the procedure: Do not sign important  documents. Do not drink alcohol. Do your regular daily activities at a slower pace than normal. Eat soft foods that are easy to digest. Take over-the-counter and prescription medicines only as told by your health care provider. Keep all follow-up visits. This is important. Contact a health care provider if: You have blood in your stool 2-3 days after the procedure. Get help right away if: You have more than a small spotting of blood in your stool. You have large blood clots in your stool. You have swelling of your abdomen. You have nausea or vomiting. You have a fever. You have increasing pain in your abdomen that is not relieved with medicine. These symptoms may be an emergency. Get help right away. Call 911. Do not wait to see if the symptoms will go away. Do not drive yourself to the hospital. Summary After  the procedure, it is common to have a small amount of blood in your stool. You may also have mild cramping and bloating of your abdomen. If you were given a sedative during the procedure, it can affect you for several hours. Do not drive or operate machinery until your health care provider says that it is safe. Get help right away if you have a lot of blood in your stool, nausea or vomiting, a fever, or increased pain in your abdomen. This information is not intended to replace advice given to you by your health care provider. Make sure you discuss any questions you have with your health care provider. Document Revised: 10/26/2020 Document Reviewed: 10/26/2020 Elsevier Patient Education  2023 Elsevier Inc. Monitored Anesthesia Care, Care After The following information offers guidance on how to care for yourself after your procedure. Your health care provider may also give you more specific instructions. If you have problems or questions, contact your health care provider. What can I expect after the procedure? After the procedure, it is common to have: Tiredness. Little or no  memory about what happened during or after the procedure. Impaired judgment when it comes to making decisions. Nausea or vomiting. Some trouble with balance. Follow these instructions at home: For the time period you were told by your health care provider:  Rest. Do not participate in activities where you could fall or become injured. Do not drive or use machinery. Do not drink alcohol. Do not take sleeping pills or medicines that cause drowsiness. Do not make important decisions or sign legal documents. Do not take care of children on your own. Medicines Take over-the-counter and prescription medicines only as told by your health care provider. If you were prescribed antibiotics, take them as told by your health care provider. Do not stop using the antibiotic even if you start to feel better. Eating and drinking Follow instructions from your health care provider about what you may eat and drink. Drink enough fluid to keep your urine pale yellow. If you vomit: Drink clear fluids slowly and in small amounts as you are able. Clear fluids include water, ice chips, low-calorie sports drinks, and fruit juice that has water added to it (diluted fruit juice). Eat light and bland foods in small amounts as you are able. These foods include bananas, applesauce, rice, lean meats, toast, and crackers. General instructions  Have a responsible adult stay with you for the time you are told. It is important to have someone help care for you until you are awake and alert. If you have sleep apnea, surgery and some medicines can increase your risk for breathing problems. Follow instructions from your health care provider about wearing your sleep device: When you are sleeping. This includes during daytime naps. While taking prescription pain medicines, sleeping medicines, or medicines that make you drowsy. Do not use any products that contain nicotine or tobacco. These products include cigarettes, chewing  tobacco, and vaping devices, such as e-cigarettes. If you need help quitting, ask your health care provider. Contact a health care provider if: You feel nauseous or vomit every time you eat or drink. You feel light-headed. You are still sleepy or having trouble with balance after 24 hours. You get a rash. You have a fever. You have redness or swelling around the IV site. Get help right away if: You have trouble breathing. You have new confusion after you get home. These symptoms may be an emergency. Get help right away. Call 911. Do not  wait to see if the symptoms will go away. Do not drive yourself to the hospital. This information is not intended to replace advice given to you by your health care provider. Make sure you discuss any questions you have with your health care provider. Document Revised: 07/31/2021 Document Reviewed: 07/31/2021 Elsevier Patient Education  [place].

## 2022-07-27 ENCOUNTER — Encounter (HOSPITAL_COMMUNITY): Payer: Self-pay

## 2022-07-27 ENCOUNTER — Ambulatory Visit (HOSPITAL_COMMUNITY)
Admission: RE | Admit: 2022-07-27 | Discharge: 2022-07-27 | Disposition: A | Discharge status: Home / Self Care | Payer: Medicare Other | Attending: Internal Medicine | Admitting: Internal Medicine

## 2022-07-27 ENCOUNTER — Ambulatory Visit (HOSPITAL_BASED_OUTPATIENT_CLINIC_OR_DEPARTMENT_OTHER): Payer: Medicare Other | Admitting: Anesthesiology

## 2022-07-27 ENCOUNTER — Encounter (HOSPITAL_COMMUNITY)
Admission: RE | Disposition: A | Discharge status: Home / Self Care | Payer: Self-pay | Source: Home / Self Care | Attending: Internal Medicine

## 2022-07-27 ENCOUNTER — Ambulatory Visit (HOSPITAL_COMMUNITY): Payer: Medicare Other | Admitting: Anesthesiology

## 2022-07-27 DIAGNOSIS — I5189 Other ill-defined heart diseases: Secondary | ICD-10-CM | POA: Diagnosis not present

## 2022-07-27 DIAGNOSIS — I2510 Atherosclerotic heart disease of native coronary artery without angina pectoris: Secondary | ICD-10-CM | POA: Insufficient documentation

## 2022-07-27 DIAGNOSIS — F419 Anxiety disorder, unspecified: Secondary | ICD-10-CM | POA: Insufficient documentation

## 2022-07-27 DIAGNOSIS — K2970 Gastritis, unspecified, without bleeding: Secondary | ICD-10-CM

## 2022-07-27 DIAGNOSIS — K648 Other hemorrhoids: Secondary | ICD-10-CM | POA: Diagnosis not present

## 2022-07-27 DIAGNOSIS — D126 Benign neoplasm of colon, unspecified: Secondary | ICD-10-CM | POA: Diagnosis not present

## 2022-07-27 DIAGNOSIS — R634 Abnormal weight loss: Secondary | ICD-10-CM | POA: Insufficient documentation

## 2022-07-27 DIAGNOSIS — Z1211 Encounter for screening for malignant neoplasm of colon: Secondary | ICD-10-CM | POA: Diagnosis not present

## 2022-07-27 DIAGNOSIS — Z6821 Body mass index (BMI) 21.0-21.9, adult: Secondary | ICD-10-CM | POA: Insufficient documentation

## 2022-07-27 DIAGNOSIS — K2289 Other specified disease of esophagus: Secondary | ICD-10-CM

## 2022-07-27 DIAGNOSIS — D124 Benign neoplasm of descending colon: Secondary | ICD-10-CM | POA: Diagnosis not present

## 2022-07-27 DIAGNOSIS — F17210 Nicotine dependence, cigarettes, uncomplicated: Secondary | ICD-10-CM | POA: Insufficient documentation

## 2022-07-27 DIAGNOSIS — B9681 Helicobacter pylori [H. pylori] as the cause of diseases classified elsewhere: Secondary | ICD-10-CM | POA: Diagnosis not present

## 2022-07-27 DIAGNOSIS — I10 Essential (primary) hypertension: Secondary | ICD-10-CM | POA: Insufficient documentation

## 2022-07-27 DIAGNOSIS — K635 Polyp of colon: Secondary | ICD-10-CM | POA: Diagnosis not present

## 2022-07-27 DIAGNOSIS — J449 Chronic obstructive pulmonary disease, unspecified: Secondary | ICD-10-CM | POA: Diagnosis not present

## 2022-07-27 DIAGNOSIS — F17200 Nicotine dependence, unspecified, uncomplicated: Secondary | ICD-10-CM | POA: Diagnosis not present

## 2022-07-27 HISTORY — PX: COLONOSCOPY WITH PROPOFOL: SHX5780

## 2022-07-27 HISTORY — PX: POLYPECTOMY: SHX5525

## 2022-07-27 HISTORY — PX: ESOPHAGOGASTRODUODENOSCOPY (EGD) WITH PROPOFOL: SHX5813

## 2022-07-27 SURGERY — COLONOSCOPY WITH PROPOFOL
Anesthesia: General

## 2022-07-27 MED ORDER — PROPOFOL 500 MG/50ML IV EMUL
INTRAVENOUS | Status: DC | PRN
  Administered 2022-07-27: 150 ug/kg/min via INTRAVENOUS

## 2022-07-27 MED ORDER — PROPOFOL 10 MG/ML IV BOLUS
INTRAVENOUS | Status: DC | PRN
  Administered 2022-07-27: 80 mg via INTRAVENOUS

## 2022-07-27 MED ORDER — PANTOPRAZOLE SODIUM 40 MG PO TBEC: 40.0000 mg | DELAYED_RELEASE_TABLET | Freq: Every day | ORAL | 11 refills | Status: DC

## 2022-07-27 MED ORDER — STERILE WATER FOR IRRIGATION IR SOLN
Status: DC | PRN
  Administered 2022-07-27 (×2): 60 mL

## 2022-07-27 MED ORDER — LIDOCAINE HCL (CARDIAC) PF 100 MG/5ML IV SOSY
PREFILLED_SYRINGE | INTRAVENOUS | Status: DC | PRN
  Administered 2022-07-27: 50 mg via INTRATRACHEAL

## 2022-07-27 MED ORDER — LACTATED RINGERS IV SOLN: INTRAVENOUS | Status: DC | PRN

## 2022-07-27 NOTE — Anesthesia Preprocedure Evaluation (Signed)
Anesthesia Evaluation  Patient identified by MRN, date of birth, ID band Patient awake    Reviewed: Allergy & Precautions, H&P , NPO status , Patient's Chart, lab work & pertinent test results, reviewed documented beta blocker date and time   Airway Mallampati: II  TM Distance: >3 FB Neck ROM: full    Dental no notable dental hx.    Pulmonary neg pulmonary ROS, COPD, Current Smoker and Patient abstained from smoking.   Pulmonary exam normal breath sounds clear to auscultation       Cardiovascular Exercise Tolerance: Good hypertension, + CAD  negative cardio ROS  Rhythm:regular Rate:Normal     Neuro/Psych   Anxiety     negative neurological ROS  negative psych ROS   GI/Hepatic negative GI ROS, Neg liver ROS,,,  Endo/Other  negative endocrine ROS    Renal/GU Renal diseasenegative Renal ROS  negative genitourinary   Musculoskeletal   Abdominal   Peds  Hematology negative hematology ROS (+)   Anesthesia Other Findings   Reproductive/Obstetrics negative OB ROS                             Anesthesia Physical Anesthesia Plan  ASA: 3  Anesthesia Plan: General   Post-op Pain Management:    Induction:   PONV Risk Score and Plan: Propofol infusion  Airway Management Planned:   Additional Equipment:   Intra-op Plan:   Post-operative Plan:   Informed Consent: I have reviewed the patients History and Physical, chart, labs and discussed the procedure including the risks, benefits and alternatives for the proposed anesthesia with the patient or authorized representative who has indicated his/her understanding and acceptance.     Dental Advisory Given  Plan Discussed with: CRNA  Anesthesia Plan Comments:        Anesthesia Quick Evaluation

## 2022-07-27 NOTE — Discharge Instructions (Addendum)
EGD Discharge instructions Please read the instructions outlined below and refer to this sheet in the next few weeks. These discharge instructions provide you with general information on caring for yourself after you leave the hospital. Your doctor may also give you specific instructions. While your treatment has been planned according to the most current medical practices available, unavoidable complications occasionally occur. If you have any problems or questions after discharge, please call your doctor. ACTIVITY You may resume your regular activity but move at a slower pace for the next 24 hours.  Take frequent rest periods for the next 24 hours.  Walking will help expel (get rid of) the air and reduce the bloated feeling in your abdomen.  No driving for 24 hours (because of the anesthesia (medicine) used during the test).  You may shower.  Do not sign any important legal documents or operate any machinery for 24 hours (because of the anesthesia used during the test).  NUTRITION Drink plenty of fluids.  You may resume your normal diet.  Begin with a light meal and progress to your normal diet.  Avoid alcoholic beverages for 24 hours or as instructed by your caregiver.  MEDICATIONS You may resume your normal medications unless your caregiver tells you otherwise.  WHAT YOU CAN EXPECT TODAY You may experience abdominal discomfort such as a feeling of fullness or "gas" pains.  FOLLOW-UP Your doctor will discuss the results of your test with you.  SEEK IMMEDIATE MEDICAL ATTENTION IF ANY OF THE FOLLOWING OCCUR: Excessive nausea (feeling sick to your stomach) and/or vomiting.  Severe abdominal pain and distention (swelling).  Trouble swallowing.  Temperature over 101 F (37.8 C).  Rectal bleeding or vomiting of blood.      Colonoscopy Discharge Instructions  Read the instructions outlined below and refer to this sheet in the next few weeks. These discharge instructions provide you  with general information on caring for yourself after you leave the hospital. Your doctor may also give you specific instructions. While your treatment has been planned according to the most current medical practices available, unavoidable complications occasionally occur.   ACTIVITY You may resume your regular activity, but move at a slower pace for the next 24 hours.  Take frequent rest periods for the next 24 hours.  Walking will help get rid of the air and reduce the bloated feeling in your belly (abdomen).  No driving for 24 hours (because of the medicine (anesthesia) used during the test).   Do not sign any important legal documents or operate any machinery for 24 hours (because of the anesthesia used during the test).  NUTRITION Drink plenty of fluids.  You may resume your normal diet as instructed by your doctor.  Begin with a light meal and progress to your normal diet. Heavy or fried foods are harder to digest and may make you feel sick to your stomach (nauseated).  Avoid alcoholic beverages for 24 hours or as instructed.  MEDICATIONS You may resume your normal medications unless your doctor tells you otherwise.  WHAT YOU CAN EXPECT TODAY Some feelings of bloating in the abdomen.  Passage of more gas than usual.  Spotting of blood in your stool or on the toilet paper.  IF YOU HAD POLYPS REMOVED DURING THE COLONOSCOPY: No aspirin products for 7 days or as instructed.  No alcohol for 7 days or as instructed.  Eat a soft diet for the next 24 hours.  FINDING OUT THE RESULTS OF YOUR TEST Not all test results  are available during your visit. If your test results are not back during the visit, make an appointment with your caregiver to find out the results. Do not assume everything is normal if you have not heard from your caregiver or the medical facility. It is important for you to follow up on all of your test results.  SEEK IMMEDIATE MEDICAL ATTENTION IF: You have more than a  spotting of blood in your stool.  Your belly is swollen (abdominal distention).  You are nauseated or vomiting.  You have a temperature over 101.  You have abdominal pain or discomfort that is severe or gets worse throughout the day.   Your EGD revealed mild amount inflammation in your stomach.  I took biopsies of this to rule out infection with a bacteria called H. pylori. I also took samples of your esophagus.  Await pathology results, my office will contact you.  I am going to start you on a new medication called pantoprazole 40 mg daily.   Small bowel appeared normal.  Your colonoscopy revealed 1 polyp(s) which I removed successfully. Await pathology results, my office will contact you.  I do not think you need further colonoscopies for surveillance purposes.  Follow up in GI office in 2-3 months.   I hope you have a great rest of your week!  Hennie Duos. Marletta Lor, D.O. Gastroenterology and Hepatology [facility]

## 2022-07-27 NOTE — H&P (Signed)
Primary Care Physician:  [facility] Primary Gastroenterologist:  Dr. Marletta Lor  Pre-Procedure History & Physical: HPI:  Samantha Bruce is a 72 y.o. female is here for an EGD to be performed for weight loss and colonoscopy for colon cancer screening  Past Medical History:  Diagnosis Date   Anxiety    Chronic kidney disease    COPD (chronic obstructive pulmonary disease) (HCC)    Hyperlipidemia    Hypertension    Vertigo    Vitamin D deficiency     Past Surgical History:  Procedure Laterality Date   BREAST REDUCTION SURGERY  2000   CATARACT EXTRACTION W/PHACO Left 10/06/2021   Procedure: CATARACT EXTRACTION PHACO AND INTRAOCULAR LENS PLACEMENT (IOC);  Surgeon: Fabio Pierce, MD;  Location: [facility];  Service: Ophthalmology;  Laterality: Left;  CDE 6.82   FOOT SURGERY Left    OTHER SURGICAL HISTORY  1990s   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 Authorizing Provider  acetaminophen (TYLENOL) 500 MG tablet Take 500 mg by mouth every 6 (six) hours as needed for moderate pain.   Yes [provider]  carvedilol (COREG) 25 MG tablet Take 25 mg by mouth 2 (two) times daily. 05/20/21  Yes [provider]  cholecalciferol (VITAMIN D3) 25 MCG (1000 UNIT) tablet Take 1,000 Units by mouth daily.   Yes [provider]  cyanocobalamin (VITAMIN B12) 1000 MCG tablet Take 1,000 mcg by mouth daily.   Yes [provider]  indapamide (LOZOL) 2.5 MG tablet Take 2.5 mg by mouth in the morning and at bedtime. 07/13/21  Yes [provider]  Melatonin 10 MG TABS Take 5 mg by mouth at bedtime as needed (sleep).   Yes [provider]  NIFEdipine (PROCARDIA XL/NIFEDICAL XL) 60 MG 24 hr tablet Take 60 mg by mouth daily. 12/14/21 12/14/22 Yes [provider]  Omega-3 Fatty Acids (FISH OIL) 1200 MG CAPS Take 1,200 mg by mouth daily.   Yes [provider]  aspirin EC 81 MG tablet Take 1 tablet (81 mg total) by mouth daily. Swallow whole. 06/11/22   Mallipeddi, Vishnu P, MD  rosuvastatin (CRESTOR) 10 MG tablet Take 1 tablet (10 mg total) by mouth at bedtime. 06/11/22   Mallipeddi, Orion Modest, MD    Allergies as of 06/22/2022 - Review Complete 06/11/2022  Allergen Reaction Noted   Tylenol with codeine #3 [acetaminophen-codeine] Itching and Rash 07/25/2016   Vicodin [hydrocodone-acetaminophen] Itching and Rash 08/28/2013    Family History  Problem Relation Age of Onset   Hypertension Mother    Heart attack Father    Diabetes Sister    Cancer Sister        breast   Hypertension Brother    Arthritis Brother        rheumatoid   Schizophrenia Son    Colon cancer Neg Hx     Social History   Socioeconomic History   Marital status: Married    Spouse name: Not on file   Number of children: Not on file   Years of education: Not on file   Highest education level: Not on file  Occupational History   Not on file  Tobacco Use   Smoking status: Light Smoker    Packs/day: 1.00    Years: 56.00    Additional pack years: 0.00    Total pack years: 56.00    Types: Cigarettes  Start date: 04/16/1966   Smokeless tobacco: Never   Tobacco comments:    1-2 cigarettes per day     Verified by [facility] 07/13/2022  Vaping Use   Vaping Use: Never used  Substance and Sexual Activity   Alcohol use: No   Drug use: No   Sexual activity: Not on file  Other Topics Concern   Not on file  Social History Narrative   Lives at home. Retired.  Education 11th grade.  4 Children.  Caffeine decaf coffee 2 cups daily.   Social Determinants of Health   Financial Resource Strain: Not on file  Food Insecurity: Not on file  Transportation Needs: Not on file  Physical Activity: Not on file  Stress: Not on file  Social Connections: Not on file  Intimate Partner Violence: Not on file    Review of Systems: General: Negative for fever, chills,  fatigue, weakness. Eyes: Negative for vision changes.  ENT: Negative for hoarseness, difficulty swallowing , nasal congestion. CV: Negative for chest pain, angina, palpitations, dyspnea on exertion, peripheral edema.  Respiratory: Negative for dyspnea at rest, dyspnea on exertion, cough, sputum, wheezing.  GI: See history of present illness. GU:  Negative for dysuria, hematuria, urinary incontinence, urinary frequency, nocturnal urination.  MS: Negative for joint pain, low back pain.  Derm: Negative for rash or itching.  Neuro: Negative for weakness, abnormal sensation, seizure, frequent headaches, memory loss, confusion.  Psych: Negative for anxiety, depression Endo: Negative for unusual weight change.  Heme: Negative for bruising or bleeding. Allergy: Negative for rash or hives.  Physical Exam: Vital signs in last 24 hours: Temp:  [97.5 F (36.4 C)] 97.5 F (36.4 C) (05/10 0924) Pulse Rate:  [48] 48 (05/10 0924) Resp:  [14] 14 (05/10 0924) BP: (195)/(82) 195/82 (05/10 0924) SpO2:  [98 %] 98 % (05/10 0924) Weight:  [60.8 kg] 60.8 kg (05/10 0924)   General:   Alert,  Well-developed, well-nourished, pleasant and cooperative in NAD Head:  Normocephalic and atraumatic. Eyes:  Sclera clear, no icterus.   Conjunctiva pink. Ears:  Normal auditory acuity. Nose:  No deformity, discharge,  or lesions. Msk:  Symmetrical without gross deformities. Normal posture. Extremities:  Without clubbing or edema. Neurologic:  Alert and  oriented x4;  grossly normal neurologically. Skin:  Intact without significant lesions or rashes. Psych:  Alert and cooperative. Normal mood and affect.   Impression/Plan: Samantha Bruce is here for an EGD to be performed for weight loss and colonoscopy for colon cancer screening  Risks, benefits, limitations, imponderables and alternatives regarding EGD have been reviewed with the patient. Questions have been answered. All parties agreeable.

## 2022-07-27 NOTE — Op Note (Signed)
[facility] Patient Name: Samantha Bruce Procedure Date: 07/27/2022 10:16 AM MRN: [id number] Date of Birth: 11-17-1950 Attending MD: Hennie Duos. Marletta Lor , [place], [id number] CSN: [id number] Age: 72 Admit Type: Outpatient Procedure:                Colonoscopy Indications:              Screening for colorectal malignant neoplasm Providers:                Hennie Duos. Marletta Lor, DO, Nena Polio, RN, Pandora Leiter, Technician Referring MD:              Medicines:                See the Anesthesia note for documentation of the                            administered medications Complications:            No immediate complications. Estimated Blood Loss:     Estimated blood loss was minimal. Procedure:                Pre-Anesthesia Assessment:                           - The anesthesia plan was to use monitored                            anesthesia care (MAC).                           After obtaining informed consent, the colonoscope                            was passed under direct vision. Throughout the                            procedure, the patient's blood pressure, pulse, and                            oxygen saturations were monitored continuously. The                            [id number]-HQ190L ([id number]) was introduced through the                            anus and advanced to the the cecum, identified by                            appendiceal orifice and ileocecal valve. The                            colonoscopy was performed without difficulty. The                            patient tolerated the procedure well. The  quality                            of the bowel preparation was evaluated using the                            BBPS [facility] Bowel Preparation Scale) with scores                            of: Right Colon = 3, Transverse Colon = 3 and Left                            Colon = 3 (entire mucosa seen well with no residual                             staining, small fragments of stool or opaque                            liquid). The total BBPS score equals 9. Scope In: 10:40:20 AM Scope Out: 10:57:57 AM Scope Withdrawal Time: 0 hours 14 minutes 24 seconds  Total Procedure Duration: 0 hours 17 minutes 37 seconds  Findings:      Non-bleeding internal hemorrhoids were found during endoscopy.      A 6 mm polyp was found in the descending colon. The polyp was sessile.       The polyp was removed with a cold snare. Resection and retrieval were       complete.      The exam was otherwise without abnormality. Impression:               - Non-bleeding internal hemorrhoids.                           - One 6 mm polyp in the descending colon, removed                            with a cold snare. Resected and retrieved.                           - The examination was otherwise normal. Moderate Sedation:      Per Anesthesia Care Recommendation:           - Patient has a contact number available for                            emergencies. The signs and symptoms of potential                            delayed complications were discussed with the                            patient. Return to normal activities tomorrow.                            Written discharge instructions were provided to the  patient.                           - Resume previous diet.                           - Continue present medications.                           - Await pathology results.                           - No repeat colonoscopy due to age.                           - Return to GI clinic in 3 months. Procedure Code(s):        --- Professional ---                           [phone], Colonoscopy, flexible; with removal of                            tumor(s), polyp(s), or other lesion(s) by snare                            technique Diagnosis Code(s):        --- Professional ---                           Z12.11, Encounter for screening for  malignant                            neoplasm of colon                           D12.4, Benign neoplasm of descending colon                           K64.8, Other hemorrhoids CPT copyright 2022 American Medical Association. All rights reserved. The codes documented in this report are preliminary and upon coder review may  be revised to meet current compliance requirements. Hennie Duos. Marletta Lor, DO Hennie Duos. Marletta Lor, DO 07/27/2022 10:59:52 AM This report has been signed electronically. Number of Addenda: 0

## 2022-07-27 NOTE — Transfer of Care (Signed)
Immediate Anesthesia Transfer of Care Note  Patient: Samantha Bruce  Procedure(s) Performed: COLONOSCOPY WITH PROPOFOL ESOPHAGOGASTRODUODENOSCOPY (EGD) WITH PROPOFOL POLYPECTOMY  Patient Location: Endoscopy Unit  Anesthesia Type:General  Level of Consciousness: awake, alert , oriented, and patient cooperative  Airway & Oxygen Therapy: Patient Spontanous Breathing and Patient connected to nasal cannula oxygen  Post-op Assessment: Report given to RN, Post -op Vital signs reviewed and stable, and Patient moving all extremities  Post vital signs: Reviewed and stable  Last Vitals:  Vitals Value Taken Time  BP 112/52 07/27/22 1103  Temp 36.7 C 07/27/22 1103  Pulse 50 07/27/22 1103  Resp 13 07/27/22 1103  SpO2 95 % 07/27/22 1103    Last Pain:  Vitals:   07/27/22 1103  TempSrc: Oral  PainSc: 0-No pain         Complications: No notable events documented.

## 2022-07-27 NOTE — Op Note (Signed)
[facility] Patient Name: Samantha Bruce Procedure Date: 07/27/2022 10:17 AM MRN: [id number] Date of Birth: 1950/06/16 Attending MD: Hennie Duos. Marletta Lor , [place], [id number] CSN: [id number] Age: 72 Admit Type: Outpatient Procedure:                Upper GI endoscopy Indications:              Weight loss Providers:                Hennie Duos. Marletta Lor, DO, Nena Polio, RN, Pandora Leiter, Technician Referring MD:              Medicines:                See the Anesthesia note for documentation of the                            administered medications Complications:            No immediate complications. Estimated Blood Loss:     Estimated blood loss was minimal. Procedure:                Pre-Anesthesia Assessment:                           - The anesthesia plan was to use monitored                            anesthesia care (MAC).                           After obtaining informed consent, the endoscope was                            passed under direct vision. Throughout the                            procedure, the patient's blood pressure, pulse, and                            oxygen saturations were monitored continuously. The                            [id number]-[id number] ([id number]) scope was introduced through the                            mouth, and advanced to the second part of duodenum.                            The upper GI endoscopy was accomplished without                            difficulty. The patient tolerated the procedure                            well. Scope In: 10:29:03 AM  Scope Out: 10:34:16 AM Total Procedure Duration: 0 hours 5 minutes 13 seconds  Findings:      The Z-line was irregular and was found 38 cm from the incisors. Biopsies       were taken with a cold forceps for histology.      Patchy mild inflammation characterized by erythema was found in the       entire examined stomach. Biopsies were taken with a cold forceps for        Helicobacter pylori testing.      The duodenal bulb, first portion of the duodenum and second portion of       the duodenum were normal. Impression:               - Z-line irregular, 38 cm from the incisors.                            Biopsied.                           - Gastritis. Biopsied.                           - Normal duodenal bulb, first portion of the                            duodenum and second portion of the duodenum. Moderate Sedation:      Per Anesthesia Care Recommendation:           - Patient has a contact number available for                            emergencies. The signs and symptoms of potential                            delayed complications were discussed with the                            patient. Return to normal activities tomorrow.                            Written discharge instructions were provided to the                            patient.                           - Resume previous diet.                           - Continue present medications.                           - Await pathology results.                           - Use Protonix (pantoprazole) 40 mg PO daily.                           -  Return to GI clinic in 3 months. Procedure Code(s):        --- Professional ---                           [phone], Esophagogastroduodenoscopy, flexible,                            transoral; with biopsy, single or multiple Diagnosis Code(s):        --- Professional ---                           K22.89, Other specified disease of esophagus                           K29.70, Gastritis, unspecified, without bleeding                           R63.4, Abnormal weight loss CPT copyright 2022 American Medical Association. All rights reserved. The codes documented in this report are preliminary and upon coder review may  be revised to meet current compliance requirements. Hennie Duos. Marletta Lor, DO Hennie Duos. Griffen Frayne, DO 07/27/2022 10:36:03 AM This report has been signed  electronically. Number of Addenda: 0

## 2022-07-28 NOTE — Anesthesia Postprocedure Evaluation (Signed)
Anesthesia Post Note  Patient: Samantha Bruce  Procedure(s) Performed: COLONOSCOPY WITH PROPOFOL ESOPHAGOGASTRODUODENOSCOPY (EGD) WITH PROPOFOL POLYPECTOMY  Patient location during evaluation: Phase II Anesthesia Type: General Level of consciousness: awake Pain management: pain level controlled Vital Signs Assessment: post-procedure vital signs reviewed and stable Respiratory status: spontaneous breathing and respiratory function stable Cardiovascular status: blood pressure returned to baseline and stable Postop Assessment: no headache and no apparent nausea or vomiting Anesthetic complications: no Comments: Late entry   No notable events documented.   Last Vitals:  Vitals:   07/27/22 0924 07/27/22 1103  BP: (!) 195/82 (!) 112/52  Pulse: (!) 48 (!) 50  Resp: 14 13  Temp: (!) 36.4 C 36.7 C  SpO2: 98% 95%    Last Pain:  Vitals:   07/27/22 1103  TempSrc: Oral  PainSc: 0-No pain                 Windell Norfolk

## 2022-07-30 LAB — SURGICAL PATHOLOGY

## 2022-07-31 ENCOUNTER — Other Ambulatory Visit: Payer: Self-pay | Admitting: Internal Medicine

## 2022-07-31 MED ORDER — BISMUTH 262 MG PO CHEW: 2.0000 | CHEWABLE_TABLET | Freq: Four times a day (QID) | ORAL | 0 refills | Status: AC

## 2022-07-31 MED ORDER — TETRACYCLINE HCL 500 MG PO CAPS: 500.0000 mg | ORAL_CAPSULE | Freq: Four times a day (QID) | ORAL | 0 refills | Status: AC

## 2022-07-31 MED ORDER — PANTOPRAZOLE SODIUM 40 MG PO TBEC: 40.0000 mg | DELAYED_RELEASE_TABLET | Freq: Two times a day (BID) | ORAL | 11 refills | Status: DC

## 2022-07-31 MED ORDER — METRONIDAZOLE 500 MG PO TABS: 500.0000 mg | ORAL_TABLET | Freq: Three times a day (TID) | ORAL | 0 refills | Status: AC

## 2022-08-02 ENCOUNTER — Encounter (HOSPITAL_COMMUNITY): Payer: Self-pay | Admitting: Internal Medicine

## 2022-08-16 ENCOUNTER — Other Ambulatory Visit: Payer: Self-pay

## 2022-08-16 ENCOUNTER — Encounter (HOSPITAL_BASED_OUTPATIENT_CLINIC_OR_DEPARTMENT_OTHER): Payer: Self-pay

## 2022-08-16 ENCOUNTER — Emergency Department (HOSPITAL_BASED_OUTPATIENT_CLINIC_OR_DEPARTMENT_OTHER)
Admission: EM | Admit: 2022-08-16 | Discharge: 2022-08-16 | Disposition: A | Discharge status: Home / Self Care | Payer: Medicare Other | Attending: Emergency Medicine | Admitting: Emergency Medicine

## 2022-08-16 DIAGNOSIS — I129 Hypertensive chronic kidney disease with stage 1 through stage 4 chronic kidney disease, or unspecified chronic kidney disease: Secondary | ICD-10-CM | POA: Insufficient documentation

## 2022-08-16 DIAGNOSIS — N179 Acute kidney failure, unspecified: Secondary | ICD-10-CM | POA: Insufficient documentation

## 2022-08-16 DIAGNOSIS — J449 Chronic obstructive pulmonary disease, unspecified: Secondary | ICD-10-CM | POA: Insufficient documentation

## 2022-08-16 DIAGNOSIS — I951 Orthostatic hypotension: Secondary | ICD-10-CM | POA: Diagnosis not present

## 2022-08-16 DIAGNOSIS — E876 Hypokalemia: Secondary | ICD-10-CM | POA: Diagnosis not present

## 2022-08-16 DIAGNOSIS — Z79899 Other long term (current) drug therapy: Secondary | ICD-10-CM | POA: Insufficient documentation

## 2022-08-16 DIAGNOSIS — R55 Syncope and collapse: Secondary | ICD-10-CM | POA: Diagnosis present

## 2022-08-16 DIAGNOSIS — N189 Chronic kidney disease, unspecified: Secondary | ICD-10-CM | POA: Diagnosis not present

## 2022-08-16 DIAGNOSIS — Z7982 Long term (current) use of aspirin: Secondary | ICD-10-CM | POA: Insufficient documentation

## 2022-08-16 LAB — BASIC METABOLIC PANEL
Anion gap: 8 (ref 5–15)
BUN: 63 mg/dL — ABNORMAL HIGH (ref 8–23)
CO2: 26 mmol/L (ref 22–32)
Calcium: 8.4 mg/dL — ABNORMAL LOW (ref 8.9–10.3)
Chloride: 97 mmol/L — ABNORMAL LOW (ref 98–111)
Creatinine, Ser: 2.58 mg/dL — ABNORMAL HIGH (ref 0.44–1.00)
GFR, Estimated: 19 mL/min — ABNORMAL LOW (ref >60)
Glucose, Bld: 135 mg/dL — ABNORMAL HIGH (ref 70–99)
Potassium: 2.9 mmol/L — ABNORMAL LOW (ref 3.5–5.1)
Sodium: 131 mmol/L — ABNORMAL LOW (ref 135–145)

## 2022-08-16 LAB — URINALYSIS, ROUTINE W REFLEX MICROSCOPIC
Bilirubin Urine: NEGATIVE
Glucose, UA: NEGATIVE mg/dL
Hgb urine dipstick: NEGATIVE
Ketones, ur: NEGATIVE mg/dL
Leukocytes,Ua: NEGATIVE
Nitrite: POSITIVE — AB
Protein, ur: NEGATIVE mg/dL
Specific Gravity, Urine: 1.025 (ref 1.005–1.030)
pH: 6 (ref 5.0–8.0)

## 2022-08-16 LAB — URINALYSIS, MICROSCOPIC (REFLEX)
RBC / HPF: NONE SEEN RBC/hpf (ref 0–5)
WBC, UA: NONE SEEN WBC/hpf (ref 0–5)

## 2022-08-16 LAB — MAGNESIUM: Magnesium: 1.8 mg/dL (ref 1.7–2.4)

## 2022-08-16 LAB — CBC
HCT: 46.5 % — ABNORMAL HIGH (ref 36.0–46.0)
Hemoglobin: 15.7 g/dL — ABNORMAL HIGH (ref 12.0–15.0)
MCH: 29 pg (ref 26.0–34.0)
MCHC: 33.8 g/dL (ref 30.0–36.0)
MCV: 85.8 fL (ref 80.0–100.0)
Platelets: 158 10*3/uL (ref 150–400)
RBC: 5.42 MIL/uL — ABNORMAL HIGH (ref 3.87–5.11)
RDW: 13.5 % (ref 11.5–15.5)
WBC: 7 10*3/uL (ref 4.0–10.5)
nRBC: 0 % (ref 0.0–0.2)

## 2022-08-16 LAB — CBG MONITORING, ED: Glucose-Capillary: 133 mg/dL — ABNORMAL HIGH (ref 70–99)

## 2022-08-16 MED ORDER — LACTATED RINGERS IV BOLUS
1000.0000 mL | Freq: Once | INTRAVENOUS | Status: AC
  Administered 2022-08-16: 1000 mL via INTRAVENOUS

## 2022-08-16 MED ORDER — AMLODIPINE BESYLATE 5 MG PO TABS
5.0000 mg | ORAL_TABLET | Freq: Once | ORAL | Status: AC
  Administered 2022-08-16: 5 mg via ORAL
  Filled 2022-08-16: qty 1

## 2022-08-16 MED ORDER — AMLODIPINE BESYLATE 5 MG PO TABS: 5.0000 mg | ORAL_TABLET | Freq: Once | ORAL | Status: DC

## 2022-08-16 MED ORDER — AMLODIPINE BESYLATE 5 MG PO TABS: 5.0000 mg | ORAL_TABLET | Freq: Every day | ORAL | 0 refills | Status: DC

## 2022-08-16 MED ORDER — POTASSIUM CHLORIDE 20 MEQ PO PACK
60.0000 meq | PACK | ORAL | Status: AC
  Administered 2022-08-16: 60 meq via ORAL
  Filled 2022-08-16: qty 3

## 2022-08-16 NOTE — ED Notes (Signed)
Lab notified of mag order 

## 2022-08-16 NOTE — Discharge Instructions (Addendum)
You were seen for your dehydration in the emergency department.   At home, please stay well hydrated. Please start taking the amlodipine and stop taking the indapamide.     Check your MyChart online for the results of any tests that had not resulted by the time you left the emergency department.   Follow-up with your primary doctor in 2-3 days regarding your visit.  Follow up with your nephrologist as soon as possible.   Return immediately to the emergency department if you experience any of the following: difficulty breathing, chest pain, shortness of breath, or any other concerning symptoms.    Thank you for visiting our Emergency Department. It was a pleasure taking care of you today.

## 2022-08-16 NOTE — ED Provider Notes (Signed)
Samantha Bruce Provider Note   CSN: [id number] Arrival date & time: 08/16/22  1703     History  Chief Complaint  Patient presents with   Loss of Consciousness   Weakness    Samantha Bruce is a 72 y.o. female.  72 year old female with a history of CKD, COPD, hypertension, and hyperlipidemia who presents emergency department after syncopal episode.  Patient is being treated for H. pylori and has had decreased appetite for several weeks.  Says that she has been feeling dizzy when standing recently.  Also has had nausea and diarrhea.  No bloody or tarry stools.  Not on blood thinners.  Says that she got up from her bedroom this morning and walked to the kitchen and daughter heard her fall to the ground.  Says that she felt very lightheaded before this but no chest pain, shortness of breath, or other preceding symptoms.  Unsure if she hit her head on the way down but does not have any headache.  No personal cardiac history.       Home Medications Prior to Admission medications   Medication Sig Start Date End Date Taking? Authorizing Provider  amLODipine (NORVASC) 5 MG tablet Take 1 tablet (5 mg total) by mouth daily. 08/16/22  Yes Rondel Baton, MD  acetaminophen (TYLENOL) 500 MG tablet Take 500 mg by mouth every 6 (six) hours as needed for moderate pain.    [provider]  aspirin EC 81 MG tablet Take 1 tablet (81 mg total) by mouth daily. Swallow whole. 06/11/22   Mallipeddi, Vishnu P, MD  carvedilol (COREG) 25 MG tablet Take 25 mg by mouth 2 (two) times daily. 05/20/21   [provider]  cholecalciferol (VITAMIN D3) 25 MCG (1000 UNIT) tablet Take 1,000 Units by mouth daily.    [provider]  cyanocobalamin (VITAMIN B12) 1000 MCG tablet Take 1,000 mcg by mouth daily.    [provider]  Melatonin 10 MG TABS Take 5 mg by mouth at bedtime as needed (sleep).    [provider]  NIFEdipine  (PROCARDIA XL/NIFEDICAL XL) 60 MG 24 hr tablet Take 60 mg by mouth daily. 12/14/21 12/14/22  [provider]  Omega-3 Fatty Acids (FISH OIL) 1200 MG CAPS Take 1,200 mg by mouth daily.    [provider]  pantoprazole (PROTONIX) 40 MG tablet Take 1 tablet (40 mg total) by mouth 2 (two) times daily. 07/31/22 07/31/23  Lanelle Bal, DO  rosuvastatin (CRESTOR) 10 MG tablet Take 1 tablet (10 mg total) by mouth at bedtime. 06/11/22   Mallipeddi, Orion Modest, MD      Allergies    Tylenol with codeine #3 [acetaminophen-codeine] and Vicodin [hydrocodone-acetaminophen]    Review of Systems   Review of Systems  Physical Exam Updated Vital Signs BP (!) 154/60   Pulse (!) 57   Temp 98.2 F (36.8 C)   Resp (!) 21   Ht 5\' 6"  (1.676 m)   Wt 60.8 kg   SpO2 98%   BMI 21.63 kg/m  Physical Exam Vitals and nursing note reviewed.  Constitutional:      General: She is not in acute distress.    Appearance: She is well-developed.  HENT:     Head: Normocephalic and atraumatic.     Right Ear: External ear normal.     Left Ear: External ear normal.     Nose: Nose normal.  Eyes:     Extraocular Movements: Extraocular movements  intact.     Conjunctiva/sclera: Conjunctivae normal.     Pupils: Pupils are equal, round, and reactive to light.  Neck:     Comments: No C-spine midline tenderness to palpation Cardiovascular:     Rate and Rhythm: Normal rate and regular rhythm.     Heart sounds: No murmur heard. Pulmonary:     Effort: Pulmonary effort is normal. No respiratory distress.     Breath sounds: Normal breath sounds.  Abdominal:     General: Abdomen is flat. There is no distension.     Palpations: Abdomen is soft. There is no mass.     Tenderness: There is no abdominal tenderness. There is no guarding.  Musculoskeletal:     Cervical back: Normal range of motion and neck supple.     Right lower leg: No edema.     Left lower leg: No edema.  Skin:    General: Skin is warm and  dry.  Neurological:     General: No focal deficit present.     Mental Status: She is alert and oriented to person, place, and time. Mental status is at baseline.     Cranial Nerves: No cranial nerve deficit.     Sensory: No sensory deficit.     Motor: No weakness.  Psychiatric:        Mood and Affect: Mood normal.     ED Results / Procedures / Treatments   Labs (all labs ordered are listed, but only abnormal results are displayed) Labs Reviewed  BASIC METABOLIC PANEL - Abnormal; Notable for the following components:      Result Value   Sodium 131 (*)    Potassium 2.9 (*)    Chloride 97 (*)    Glucose, Bld 135 (*)    BUN 63 (*)    Creatinine, Ser 2.58 (*)    Calcium 8.4 (*)    GFR, Estimated 19 (*)    All other components within normal limits  CBC - Abnormal; Notable for the following components:   RBC 5.42 (*)    Hemoglobin 15.7 (*)    HCT 46.5 (*)    All other components within normal limits  URINALYSIS, ROUTINE W REFLEX MICROSCOPIC - Abnormal; Notable for the following components:   Nitrite POSITIVE (*)    All other components within normal limits  URINALYSIS, MICROSCOPIC (REFLEX) - Abnormal; Notable for the following components:   Bacteria, UA FEW (*)    All other components within normal limits  CBG MONITORING, ED - Abnormal; Notable for the following components:   Glucose-Capillary 133 (*)    All other components within normal limits  MAGNESIUM    EKG EKG Interpretation  Date/Time:  Thursday Aug 16 2022 17:22:17 EDT Ventricular Rate:  52 PR Interval:  158 QRS Duration: 96 QT Interval:  435 QTC Calculation: 405 R Axis:   63 Text Interpretation: Sinus rhythm Probable left ventricular hypertrophy Confirmed by Vonita Moss [phone]) on 08/16/2022 5:37:00 PM  Radiology No results found.  Procedures Procedures    Medications Ordered in ED Medications  potassium chloride (KLOR-CON) packet 60 mEq (60 mEq Oral Given 08/16/22 1922)  lactated ringers bolus  1,000 mL (0 mLs Intravenous Stopped 08/16/22 2020)  amLODipine (NORVASC) tablet 5 mg (5 mg Oral Given 08/16/22 1948)    ED Course/ Medical Decision Making/ A&P Clinical Course as of 08/16/22 2050  Thu Aug 16, 2022  1915 Most recent outpatient chemistry Lab Units 05/02/21 1000 CREATININE mg/dL 6.04*  5.40* BUN mg/dL 29* POTASSIUM  mmol/L 3.9 SODIUM mmol/L 138 CO2 mmol/L 25 CHLORIDE mmol/L 101 ALBUMIN, S/P g/dL 4.2 ALBUMIN g/dL 3.9 HEMOGLOBIN V4U % of total Hgb 5.2 WBC AUTO Thousand/uL 6.1 HEMATOCRIT % 40.1 HEMOGLOBIN g/dL 98.1 PLATELETS AUTO Thousand/uL 219   [RP]  1923 Dr Marisue Humble from [facility] consulted.  Recommends discontinuing her indapamide and having her follow-up with Dr. Wolfgang Phoenix in the morning. [RP]  2040 Pt voided spontaneously without retention.  [RP]    Clinical Course User Index [RP] Rondel Baton, MD                             Medical Decision Making Amount and/or Complexity of Data Reviewed Labs: ordered.  Risk Prescription drug management.   ROSICELA BAYON is a 72 y.o. female with comorbidities that complicate the patient evaluation including hypertension, hyperlipidemia, and CKD who presents emergency department after syncopal event  Initial Ddx:  Orthostatic syncope, arrhythmia, MI, PE, anemia  MDM:  Feel the patient likely is dehydrated and had a orthostatic event that led to her syncope.  Will obtain EKG to evaluate for arrhythmia and check her electrolytes.  With her diarrhea and H. pylori he is at risk for bleeds which could cause anemia resulting her symptoms we will check her blood counts today.  No preceding symptoms suggest an MI or PE.  No signs of head trauma on exam and is not on blood thinners so we will hold off on imaging currently.  Plan:  Labs IV fluids EKG  ED Summary/Re-evaluation:  Patient underwent the above workup.  Was found to have an AKI so I discussed her case with nephrology.  They felt that after  receiving fluids she would be suitable for outpatient follow-up.  Potassium was also low and was replenished here in the emergency department.  Nephrology recommended that her indapamide be discontinued with her AKI so we will start her on amlodipine instead at this time.  Will also have her follow-up with her primary doctor in several days.  This patient presents to the ED for concern of complaints listed in HPI, this involves an extensive number of treatment options, and is a complaint that carries with it a high risk of complications and morbidity. Disposition including potential need for admission considered.   Dispo: DC Home. Return precautions discussed including, but not limited to, those listed in the AVS. Allowed pt time to ask questions which were answered fully prior to dc.  Additional history obtained from daughter Records reviewed Outpatient Clinic Notes The following labs were independently interpreted: Chemistry and show AKI and CKD I personally reviewed and interpreted cardiac monitoring: normal sinus rhythm  I personally reviewed and interpreted the pt's EKG: see above for interpretation  I have reviewed the patients home medications and made adjustments as needed Consults: Nephrology Social Determinants of health:  Elderly         Final Clinical Impression(s) / ED Diagnoses Final diagnoses:  AKI (acute kidney injury) (HCC)  Orthostatic hypotension  Hypokalemia    Rx / DC Orders ED Discharge Orders          Ordered    amLODipine (NORVASC) 5 MG tablet  Daily        08/16/22 2037              Rondel Baton, MD 08/16/22 2050

## 2022-08-16 NOTE — ED Triage Notes (Signed)
Pt BIB daughter who reports that pt has been on abx for 2 weeks and has not been eating. Pt had syncopal episode this morning. Did not hit head. No thinners.

## 2022-08-17 DIAGNOSIS — N179 Acute kidney failure, unspecified: Secondary | ICD-10-CM | POA: Diagnosis not present

## 2022-08-17 DIAGNOSIS — E871 Hypo-osmolality and hyponatremia: Secondary | ICD-10-CM | POA: Diagnosis not present

## 2022-08-17 DIAGNOSIS — E876 Hypokalemia: Secondary | ICD-10-CM | POA: Diagnosis not present

## 2022-08-17 DIAGNOSIS — I959 Hypotension, unspecified: Secondary | ICD-10-CM | POA: Diagnosis not present

## 2022-08-23 ENCOUNTER — Encounter: Payer: Self-pay | Admitting: Internal Medicine

## 2022-08-25 DIAGNOSIS — S161XXA Strain of muscle, fascia and tendon at neck level, initial encounter: Secondary | ICD-10-CM | POA: Diagnosis not present

## 2022-08-25 DIAGNOSIS — H9202 Otalgia, left ear: Secondary | ICD-10-CM | POA: Diagnosis not present

## 2022-08-25 DIAGNOSIS — Z6820 Body mass index (BMI) 20.0-20.9, adult: Secondary | ICD-10-CM | POA: Diagnosis not present

## 2022-08-25 DIAGNOSIS — R030 Elevated blood-pressure reading, without diagnosis of hypertension: Secondary | ICD-10-CM | POA: Diagnosis not present

## 2022-09-04 ENCOUNTER — Telehealth: Payer: Self-pay

## 2022-09-04 NOTE — Telephone Encounter (Signed)
Agreed needs OV

## 2022-09-04 NOTE — Telephone Encounter (Signed)
Noted  Spoke with the pt's sister who agrees with an appt to be seen. Transferred to the front office and advised if not answered please leave a message and they will return her call

## 2022-09-04 NOTE — Telephone Encounter (Signed)
Dr Marletta Lor / Ermalinda Memos The pt's sister Eunice Blase phoned and advised that she thinks the pt still has H Pylori but not sure. She states that every time her sister eats she has a BM (watery with a very FOUL odor). This is every time whether she is having a  BM or urinating.This past Thursday the pt messed up her clothes because she couldn't make it to the bathroom in time. There is no pain with this. The diarrhea is yellow in color. They are wanting to know if the infection is still there or could this be something else. Will they need a appt or ov. Please advise.

## 2022-09-04 NOTE — Telephone Encounter (Signed)
This is a change in bowel function since I saw her in the office. Recommend OV this week for further evaluation.

## 2022-09-07 ENCOUNTER — Ambulatory Visit: Payer: Medicare Other | Attending: Nurse Practitioner | Admitting: Nurse Practitioner

## 2022-09-07 ENCOUNTER — Encounter: Payer: Self-pay | Admitting: Nurse Practitioner

## 2022-09-07 VITALS — BP 128/70 | HR 70 | Ht 67.0 in | Wt 132.8 lb

## 2022-09-07 DIAGNOSIS — M7989 Other specified soft tissue disorders: Secondary | ICD-10-CM

## 2022-09-07 DIAGNOSIS — I10 Essential (primary) hypertension: Secondary | ICD-10-CM | POA: Diagnosis not present

## 2022-09-07 DIAGNOSIS — E785 Hyperlipidemia, unspecified: Secondary | ICD-10-CM | POA: Diagnosis not present

## 2022-09-07 DIAGNOSIS — R29898 Other symptoms and signs involving the musculoskeletal system: Secondary | ICD-10-CM

## 2022-09-07 DIAGNOSIS — N184 Chronic kidney disease, stage 4 (severe): Secondary | ICD-10-CM

## 2022-09-07 DIAGNOSIS — I2510 Atherosclerotic heart disease of native coronary artery without angina pectoris: Secondary | ICD-10-CM

## 2022-09-07 DIAGNOSIS — N179 Acute kidney failure, unspecified: Secondary | ICD-10-CM | POA: Diagnosis not present

## 2022-09-07 NOTE — Patient Instructions (Signed)
Medication Instructions:   Remain off of the Crestor, removed from list today.  Continue all other medications.     Labwork:  BNP, CK, CPK - orders given  Office will contact with results via phone, letter or mychart.     Testing/Procedures:  none  Follow-Up:  8 weeks   Any Other Special Instructions Will Be Listed Below (If Applicable).   If you need a refill on your cardiac medications before your next appointment, please call your pharmacy.

## 2022-09-07 NOTE — Progress Notes (Unsigned)
Office Visit    Patient Name: Samantha Bruce Date of Encounter: 09/07/2022  PCP:  [facility]   [facility]  Cardiologist:  Marjo Bicker, MD *** Advanced Practice Provider:  No care team member to display Electrophysiologist:  None  {Press F2 to show EP APP, CHF, sleep or structural heart MD               :[id number]}  { Click here to update then REFRESH NOTE - MD (PCP) or APP (Team Member)  Change PCP Type for MD, Specialty for APP is either Cardiology or Clinical Cardiac Electrophysiology  :[id number]}  Chief Complaint    URIJAH RAYNOR is a 72 y.o. female with a hx of coronary artery calcifications/CAD, hyperlipidemia, hypertension, tobacco abuse, who presents today for requested office visit.  Past Medical History    Past Medical History:  Diagnosis Date   Anxiety    Chronic kidney disease    COPD (chronic obstructive pulmonary disease) (HCC)    Hyperlipidemia    Hypertension    Vertigo    Vitamin D deficiency    Past Surgical History:  Procedure Laterality Date   BREAST REDUCTION SURGERY  2000   CATARACT EXTRACTION W/PHACO Left 10/06/2021   Procedure: CATARACT EXTRACTION PHACO AND INTRAOCULAR LENS PLACEMENT (IOC);  Surgeon: Fabio Pierce, MD;  Location: [facility];  Service: Ophthalmology;  Laterality: Left;  CDE 6.82   COLONOSCOPY WITH PROPOFOL N/A 07/27/2022   Procedure: COLONOSCOPY WITH PROPOFOL;  Surgeon: Lanelle Bal, DO;  Location: [facility];  Service: Endoscopy;  Laterality: N/A;  10:45 am   ESOPHAGOGASTRODUODENOSCOPY (EGD) WITH PROPOFOL N/A 07/27/2022   Procedure: ESOPHAGOGASTRODUODENOSCOPY (EGD) WITH PROPOFOL;  Surgeon: Lanelle Bal, DO;  Location: [facility];  Service: Endoscopy;  Laterality: N/A;   FOOT SURGERY Left    OTHER SURGICAL HISTORY  1990s   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 female with a PMH as mentioned above.  Last seen by Dr. Jenene Bruce on June 11, 2022.  She was pending preoperative clearance for EGD and colonoscopy.  Due to imaging evidence of severe LM and coronary artery calcifications, was referred to cardiology for clearance.  Patient never had prior ischemic evaluation.  She is overall doing well from a cardiac perspective at the time.  Was noted she was smoking 3 cigarettes/day.  Was reported to be very active.  Was started on baby aspirin and rosuvastatin 10 mg nightly.  She comes in today for evaluation for  EKGs/Labs/Other Studies Reviewed:   The following studies were reviewed today: ***  EKG:  EKG is *** ordered today.  The ekg ordered today demonstrates ***  Recent Labs: 08/16/2022: BUN 63; Creatinine, Ser 2.58; Hemoglobin 15.7; Magnesium 1.8; Platelets 158; Potassium 2.9; Sodium 131  Recent Lipid Panel No results found for: "CHOL", "TRIG", "HDL", "CHOLHDL", "VLDL", "LDLCALC", "LDLDIRECT"  Risk Assessment/Calculations:  {Does this patient have ATRIAL FIBRILLATION?:[phone]}  Home Medications   No outpatient medications have been marked as taking for the 09/07/22 encounter (Appointment) with Samantha Dory, NP.     Review of Systems   ***  All other systems reviewed and are otherwise negative except as noted above.  Physical Exam    VS:  There were no vitals taken for this visit. , BMI There is no height or weight on file to calculate BMI.  Wt Readings from Last 3 Encounters:  08/16/22 134 lb (60.8 kg)  07/27/22 134 lb (60.8 kg)  07/24/22 134 lb (60.8 kg)     GEN: Well nourished, well developed, in no acute distress. HEENT: normal. Neck: Supple, no  JVD, carotid bruits, or masses. Cardiac: ***RRR, no murmurs, rubs, or gallops. No clubbing, cyanosis, edema.  ***Radials/PT 2+ and equal bilaterally.  Respiratory:  ***Respirations regular and unlabored, clear to auscultation bilaterally. GI: Soft, nontender, nondistended. MS: No deformity or atrophy. Skin: Warm and dry, no rash. Neuro:  Strength and sensation are intact. Psych: Normal affect.  Assessment & Plan    ***  {Are you ordering a CV Procedure (e.g. stress test, cath, DCCV, TEE, etc)?   Press F2        :[id number]}      Disposition: Follow up {follow up:15908} with Vishnu P Mallipeddi, MD or APP.  Signed, Samantha Dory, NP 09/07/2022, 12:50 PM [facility]

## 2022-09-12 DIAGNOSIS — M79671 Pain in right foot: Secondary | ICD-10-CM | POA: Diagnosis not present

## 2022-09-17 NOTE — Progress Notes (Unsigned)
Referring Provider: [facility] Primary Care Physician:  [facility] Primary GI Physician: Dr. Marletta Lor  No chief complaint on file.   HPI:   Samantha Bruce is a 72 y.o. female with history of CKD, COPD, HTN, HLD, presenting today for follow-up of unintentional weight loss and change in bowel habits.  Last seen in our office 05/23/2022.  Noted 21 pound unintentional weight loss since July 2023.  No significant upper GI symptoms aside from occasional vague early satiety though she reported she was eating well including 3 meals a day, 1 snack per day, and 1-2 protein shakes per day.  Noted change in bowel habits from soft, formed stools and intermittent diarrhea, but symptoms are mild and well-controlled with occasional Imodium.  She is scheduled for EGD and colonoscopy.  Procedures 07/27/2022: Colonoscopy: Nonbleeding internal hemorrhoids, 6 mm polyp in the descending colon removed.  Pathology with tubular adenoma.  No recommendations to repeat colonoscopy due to age. EGD: Irregular Z-line biopsied, gastritis biopsied.  Recommended pantoprazole 40 mg daily.  Pathology with H. pylori.  Recommended bismuth quadruple therapy with metronidazole, tetracycline, pantoprazole twice daily.  Per result note, patient stated she was not going to take the medications until she spoke with her kidney doctor.   Today:      CT A/P without contrast 06/06/2022: 1. No acute intra-abdominal or pelvic pathology.  2. Cholelithiasis.  3. No hydronephrosis or nephrolithiasis.  4. No bowel obstruction. Normal appendix.  5.  Aortic Atherosclerosis (ICD10-I70.0).    CT chest without contrast 05/04/2022: 1. Lung-RADS 1, negative. Continue annual screening with low-dose  chest CT without contrast in 12 months.  2. Mild bilateral patchy ground-glass opacities, likely infectious  or inflammatory.  3. Severe left main and three-vessel coronary artery calcifications,  recommend  ASCVD risk assessment.  4. Aortic Atherosclerosis (ICD10-I70.0) and Emphysema (ICD10-J43.9).  Past Medical History:  Diagnosis Date   Anxiety    Chronic kidney disease    COPD (chronic obstructive pulmonary disease) (HCC)    Hyperlipidemia    Hypertension    Vertigo    Vitamin D deficiency     Past Surgical History:  Procedure Laterality Date   BREAST REDUCTION SURGERY  2000   CATARACT EXTRACTION W/PHACO Left 10/06/2021   Procedure: CATARACT EXTRACTION PHACO AND INTRAOCULAR LENS PLACEMENT (IOC);  Surgeon: Fabio Pierce, MD;  Location: [facility];  Service: Ophthalmology;  Laterality: Left;  CDE 6.82   COLONOSCOPY WITH PROPOFOL N/A 07/27/2022   Procedure: COLONOSCOPY WITH PROPOFOL;  Surgeon: Lanelle Bal, DO;  [facility];  Service: Endoscopy;  Laterality: N/A;  10:45 am   ESOPHAGOGASTRODUODENOSCOPY (EGD) WITH PROPOFOL N/A 07/27/2022   Procedure: ESOPHAGOGASTRODUODENOSCOPY (EGD) WITH PROPOFOL;  Surgeon: Lanelle Bal, DO;  [facility];  Service: Endoscopy;  Laterality: N/A;   FOOT SURGERY Left    OTHER SURGICAL HISTORY  1990s   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     amLODipine (NORVASC) 5 MG tablet Take 1 tablet (5 mg total) by mouth daily. 30 tablet 0   aspirin EC 81 MG tablet Take 1 tablet (81 mg total) by mouth daily. Swallow whole. 90  tablet 3   carvedilol (COREG) 25 MG tablet Take 25 mg by mouth 2 (two) times daily.     cholecalciferol (VITAMIN D3) 25 MCG (1000 UNIT) tablet Take 1,000 Units by mouth daily.     Melatonin 10 MG TABS Take 5 mg by mouth at bedtime as needed (sleep).     NIFEdipine (PROCARDIA XL/NIFEDICAL XL) 60 MG 24 hr tablet  Take 60 mg by mouth daily.     Omega-3 Fatty Acids (FISH OIL) 1200 MG CAPS Take 1,200 mg by mouth daily.     ondansetron (ZOFRAN-ODT) 4 MG disintegrating tablet Take 4 mg by mouth every 8 (eight) hours as needed.     pantoprazole (PROTONIX) 40 MG tablet Take 1 tablet (40 mg total) by mouth 2 (two) times daily. 60 tablet 11   potassium chloride (MICRO-K) 10 MEQ CR capsule Take 10 mEq by mouth daily.     No current facility-administered medications for this visit.    Allergies as of 09/19/2022 - Review Complete 09/07/2022  Allergen Reaction Noted   Crestor [rosuvastatin] Other (See Comments) 09/09/2022   Tylenol with codeine #3 [acetaminophen-codeine] Itching and Rash 07/25/2016   Vicodin [hydrocodone-acetaminophen] Itching and Rash 08/28/2013    Family History  Problem Relation Age of Onset   Hypertension Mother    Heart attack Father    Diabetes Sister    Cancer Sister        breast   Hypertension Brother    Arthritis Brother        rheumatoid   Schizophrenia Son    Colon cancer Neg Hx     Social History   Socioeconomic History   Marital status: Single    Spouse name: Not on file   Number of children: Not on file   Years of education: Not on file   Highest education level: Not on file  Occupational History   Not on file  Tobacco Use   Smoking status: Light Smoker    Packs/day: 1.00    Years: 56.00    Additional pack years: 0.00    Total pack years: 56.00    Types: Cigarettes    Start date: 04/16/1966   Smokeless tobacco: Never   Tobacco comments:    1-2 cigarettes per day     Verified by [facility] 07/13/2022  Vaping Use   Vaping Use: Never used  Substance and Sexual Activity   Alcohol use: No   Drug use: No   Sexual activity: Not on file  Other Topics Concern   Not on file  Social History Narrative   Lives at home. Retired.  Education 11th grade.  4 Children.  Caffeine decaf coffee 2 cups daily.   Social Determinants of Health   Financial Resource Strain: Not  on file  Food Insecurity: Not on file  Transportation Needs: Not on file  Physical Activity: Not on file  Stress: Not on file  Social Connections: Not on file    Review of Systems: Gen: Denies fever, chills, anorexia. Denies fatigue, weakness, weight loss.  CV: Denies chest pain, palpitations, syncope, peripheral edema, and claudication. Resp: Denies dyspnea at rest, cough, wheezing, coughing up blood, and pleurisy. GI: Denies vomiting blood, jaundice, and fecal incontinence.   Denies dysphagia or odynophagia. Derm: Denies rash, itching, dry skin Psych: Denies depression, anxiety, memory loss, confusion. No homicidal or suicidal ideation.  Heme: Denies bruising, bleeding, and enlarged lymph nodes.  Physical Exam: There were no vitals taken for this visit. General:   Alert and oriented.  No distress noted. Pleasant and cooperative.  Head:  Normocephalic and atraumatic. Eyes:  Conjuctiva clear without scleral icterus. Heart:  S1, S2 present without murmurs appreciated. Lungs:  Clear to auscultation bilaterally. No wheezes, rales, or rhonchi. No distress.  Abdomen:  +BS, soft, non-tender and non-distended. No rebound or guarding. No HSM or masses noted. Msk:  Symmetrical without gross deformities. Normal posture. Extremities:  Without edema. Neurologic:  Alert and  oriented x4 Psych:  Normal mood and affect.    Assessment:     Plan:  ***   Ermalinda Memos, PA-C [facility] 09/19/2022

## 2022-09-19 ENCOUNTER — Ambulatory Visit: Payer: Medicare Other | Admitting: Gastroenterology

## 2022-09-19 ENCOUNTER — Encounter: Payer: Self-pay | Admitting: Gastroenterology

## 2022-09-19 VITALS — BP 173/83 | HR 67 | Temp 98.0°F | Ht 66.5 in | Wt 126.4 lb

## 2022-09-19 DIAGNOSIS — R634 Abnormal weight loss: Secondary | ICD-10-CM

## 2022-09-19 DIAGNOSIS — Z8619 Personal history of other infectious and parasitic diseases: Secondary | ICD-10-CM | POA: Diagnosis not present

## 2022-09-19 NOTE — Patient Instructions (Signed)
Please have blood work completed at [facility] and request that your labs are faxed back to [place].  You will need to complete an H. pylori stool test no sooner than 7/15.  As we discussed, you must be off of all acid suppression medications including over-the-counter medications like Tums, Pepcid, Pepto-Bismol, etc., and all antibiotics for 14 days prior to this test.  I recommend that you keep a dietary log of everything that you are eating on a daily basis.  I also recommend that you weigh yourself daily to try to correlate whether or not your weight gain or weight loss is related to fluctuations in your dietary intake.  Follow-up date to be determined pending your blood work results.  It was good to see you again today!  Ermalinda Memos, PA-C [facility]

## 2022-09-21 DIAGNOSIS — I10 Essential (primary) hypertension: Secondary | ICD-10-CM | POA: Diagnosis not present

## 2022-09-21 DIAGNOSIS — N1832 Chronic kidney disease, stage 3b: Secondary | ICD-10-CM | POA: Diagnosis not present

## 2022-09-21 DIAGNOSIS — E876 Hypokalemia: Secondary | ICD-10-CM | POA: Diagnosis not present

## 2022-09-21 DIAGNOSIS — R5383 Other fatigue: Secondary | ICD-10-CM | POA: Diagnosis not present

## 2022-09-21 DIAGNOSIS — E7849 Other hyperlipidemia: Secondary | ICD-10-CM | POA: Diagnosis not present

## 2022-09-21 DIAGNOSIS — E782 Mixed hyperlipidemia: Secondary | ICD-10-CM | POA: Diagnosis not present

## 2022-09-21 DIAGNOSIS — I5032 Chronic diastolic (congestive) heart failure: Secondary | ICD-10-CM | POA: Diagnosis not present

## 2022-09-27 DIAGNOSIS — I5032 Chronic diastolic (congestive) heart failure: Secondary | ICD-10-CM | POA: Diagnosis not present

## 2022-09-27 DIAGNOSIS — N179 Acute kidney failure, unspecified: Secondary | ICD-10-CM | POA: Diagnosis not present

## 2022-09-27 DIAGNOSIS — I129 Hypertensive chronic kidney disease with stage 1 through stage 4 chronic kidney disease, or unspecified chronic kidney disease: Secondary | ICD-10-CM | POA: Diagnosis not present

## 2022-09-27 DIAGNOSIS — E871 Hypo-osmolality and hyponatremia: Secondary | ICD-10-CM | POA: Diagnosis not present

## 2022-10-05 DIAGNOSIS — I2510 Atherosclerotic heart disease of native coronary artery without angina pectoris: Secondary | ICD-10-CM | POA: Diagnosis not present

## 2022-10-05 DIAGNOSIS — R634 Abnormal weight loss: Secondary | ICD-10-CM | POA: Diagnosis not present

## 2022-10-05 DIAGNOSIS — R413 Other amnesia: Secondary | ICD-10-CM | POA: Diagnosis not present

## 2022-10-05 DIAGNOSIS — J449 Chronic obstructive pulmonary disease, unspecified: Secondary | ICD-10-CM | POA: Diagnosis not present

## 2022-10-05 DIAGNOSIS — J40 Bronchitis, not specified as acute or chronic: Secondary | ICD-10-CM | POA: Diagnosis not present

## 2022-10-05 DIAGNOSIS — Z6820 Body mass index (BMI) 20.0-20.9, adult: Secondary | ICD-10-CM | POA: Diagnosis not present

## 2022-10-05 DIAGNOSIS — R918 Other nonspecific abnormal finding of lung field: Secondary | ICD-10-CM | POA: Diagnosis not present

## 2022-10-05 DIAGNOSIS — R109 Unspecified abdominal pain: Secondary | ICD-10-CM | POA: Diagnosis not present

## 2022-10-05 DIAGNOSIS — F17210 Nicotine dependence, cigarettes, uncomplicated: Secondary | ICD-10-CM | POA: Diagnosis not present

## 2022-10-05 DIAGNOSIS — S161XXA Strain of muscle, fascia and tendon at neck level, initial encounter: Secondary | ICD-10-CM | POA: Diagnosis not present

## 2022-10-05 DIAGNOSIS — H9202 Otalgia, left ear: Secondary | ICD-10-CM | POA: Diagnosis not present

## 2022-10-05 DIAGNOSIS — R030 Elevated blood-pressure reading, without diagnosis of hypertension: Secondary | ICD-10-CM | POA: Diagnosis not present

## 2022-10-09 DIAGNOSIS — Z8619 Personal history of other infectious and parasitic diseases: Secondary | ICD-10-CM | POA: Diagnosis not present

## 2022-10-11 DIAGNOSIS — H01001 Unspecified blepharitis right upper eyelid: Secondary | ICD-10-CM | POA: Diagnosis not present

## 2022-10-11 DIAGNOSIS — D23121 Other benign neoplasm of skin of left upper eyelid, including canthus: Secondary | ICD-10-CM | POA: Diagnosis not present

## 2022-10-11 DIAGNOSIS — H25811 Combined forms of age-related cataract, right eye: Secondary | ICD-10-CM | POA: Diagnosis not present

## 2022-10-11 LAB — H. PYLORI ANTIGEN, STOOL: H pylori Ag, Stl: NEGATIVE

## 2022-10-25 DIAGNOSIS — D23121 Other benign neoplasm of skin of left upper eyelid, including canthus: Secondary | ICD-10-CM | POA: Diagnosis not present

## 2022-10-25 DIAGNOSIS — H01001 Unspecified blepharitis right upper eyelid: Secondary | ICD-10-CM | POA: Diagnosis not present

## 2022-10-25 DIAGNOSIS — H25811 Combined forms of age-related cataract, right eye: Secondary | ICD-10-CM | POA: Diagnosis not present

## 2022-10-25 NOTE — Progress Notes (Signed)
[facility] [address] [place] [address]   Clinic Day:  10/26/2022  Referring physician: Donetta Potts, MD  Patient Care Team: Practice, [facility] as PCP - General Mallipeddi, Orion Modest, MD as PCP - Cardiology (Cardiology)   ASSESSMENT & PLAN:   Assessment:  1.  Neutrophilic leukocytosis: - Patient seen at the request of Dr. Mitzi Hansen, MD for leukocytosis and weight loss - She was weighing 150 pounds in 09/2021, 132 pounds in 05/2022 - Patient's sister and patient recollects that she has changed her diet in the fall 2023 due to worsening kidney function.  She is eating more vegetables.  She reports very good appetite. - She also has memory problems. - 09/21/2022: WBC-12.2, ANC-8.9, 74% N, 19% L, 7% M, Hb-13.2, PLT-372 - CTAP (06/08/2022): Spleen normal, no lymphadenopathy.  This was done for abdominal pain and weight loss. - LDCT chest (05/04/2022): Lung RADS 1. - Colonoscopy (07/27/2022): Nonbleeding internal hemorrhoids, 6 mm tubular adenoma in descending colon. - EGD (07/27/2022): Gastritis, normal duodenum.  2.  Social/family history: - She lives with her daughter and 80-year-old granddaughter in [place] during weekdays.  On the weekends she lives by herself at home.  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  She is a current active smoker, 1 pack every 2 to 3 days.  She has been smoking since teenager. - Sister and maternal aunt had breast cancer.  Plan:  1.  Neutrophilic leukocytosis: - I will repeat CBC with differential today.  Most likely prednisone induced. - She was given prednisone for gout episodes x 3 since Christmas.  Last prednisone was on 09/12/2022.  2.  Weight loss: - She has lost about 25 pounds since July of last year. - I have reviewed CT scans and endoscopies done earlier this year. - No clear signs or symptoms of malignant etiology. - I think her weight loss could be from the combination of dietary  changes she made last fall due to worsening renal function and her memory issues. - I do not believe any appetite stimulant is necessary as her appetite is very good. - She is seeing neurology in September for memory problems. - I will see her back in 3 to 4 months to make sure that she is not losing any more weight.  If she is continuing to lose, will consider further workup.  Addendum: - We reviewed CBC from today which shows normalization of white count.   Orders Placed This Encounter  Procedures   CBC with Differential    Standing Status:   Future    Number of Occurrences:   1    Standing Expiration Date:   10/26/2023   Lactate dehydrogenase    Standing Status:   Future    Number of Occurrences:   1    Standing Expiration Date:   10/26/2023      Mikeal Hawthorne R Teague,acting as a scribe for Doreatha Massed, MD.,have documented all relevant documentation on the behalf of Doreatha Massed, MD,as directed by  Doreatha Massed, MD while in the presence of Doreatha Massed, MD.   I, Doreatha Massed MD, have reviewed the above documentation for accuracy and completeness, and I agree with the above.   Doreatha Massed, MD   8/9/20242:09 PM  CHIEF COMPLAINT/PURPOSE OF CONSULT:   Diagnosis: Leukocytosis, weight loss  Current Therapy: Observation  HISTORY OF PRESENT ILLNESS:   Cherith is a 72 y.o. female presenting to clinic today for evaluation of leukocytosis  at the request of Donetta Potts, MD.  Today, she states that she is doing well overall. Her appetite level is at 50%. Her energy level is at 25%.  She was found to have abnormal CBC from 09/21/23 with elevated WBC at 12.2 and elevated absolute neutrophils at 8.9.   Her last colonoscopy was on 07/27/22 with Dr. Marletta Lor.     PAST MEDICAL HISTORY:   Past Medical History: Past Medical History:  Diagnosis Date   Anxiety    Chronic kidney disease    COPD (chronic obstructive pulmonary disease) (HCC)     Hyperlipidemia    Hypertension    Vertigo    Vitamin D deficiency     Surgical History: Past Surgical History:  Procedure Laterality Date   BREAST REDUCTION SURGERY  2000   CATARACT EXTRACTION W/PHACO Left 10/06/2021   Procedure: CATARACT EXTRACTION PHACO AND INTRAOCULAR LENS PLACEMENT (IOC);  Surgeon: Fabio Pierce, MD;  Location: [facility];  Service: Ophthalmology;  Laterality: Left;  CDE 6.82   COLONOSCOPY WITH PROPOFOL N/A 07/27/2022   Procedure: COLONOSCOPY WITH PROPOFOL;  Surgeon: Lanelle Bal, DO;  Location: [facility];  Service: Endoscopy;  Laterality: N/A;  10:45 am   ESOPHAGOGASTRODUODENOSCOPY (EGD) WITH PROPOFOL N/A 07/27/2022   Procedure: ESOPHAGOGASTRODUODENOSCOPY (EGD) WITH PROPOFOL;  Surgeon: Lanelle Bal, DO;  Location: [facility];  Service: Endoscopy;  Laterality: N/A;   FOOT SURGERY Left    OTHER SURGICAL HISTORY  1990s   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   POLYPECTOMY  07/27/2022   Procedure: POLYPECTOMY;  Surgeon: Lanelle Bal, DO;  Location: [facility];  Service: Endoscopy;;    Social History: Social History   Socioeconomic History   Marital status: Single    Spouse name: Not on file   Number of children: Not on file   Years of education: Not on file   Highest education level: Not on file  Occupational History   Not on file  Tobacco Use   Smoking status: Light Smoker    Current packs/day: 1.00    Average packs/day: 1 pack/day for 56.5 years (56.5 ttl pk-yrs)    Types: Cigarettes    Start date: 04/16/1966   Smokeless tobacco: Never   Tobacco comments:    1-2 cigarettes per day     Verified by [facility] 07/13/2022  Vaping Use   Vaping status: Never Used  Substance and Sexual Activity   Alcohol use: No   Drug use: No   Sexual activity: Not Currently  Other Topics Concern   Not on file  Social History Narrative   Lives at home. Retired.  Education 11th grade.  4 Children.  Caffeine decaf coffee 2 cups daily.   Social  Determinants of Health   Financial Resource Strain: Not on file  Food Insecurity: No Food Insecurity (10/26/2022)   Hunger Vital Sign    Worried About Running Out of Food in the Last Year: Never true    Ran Out of Food in the Last Year: Never true  Transportation Needs: No Transportation Needs (10/26/2022)   PRAPARE - Administrator, Civil Service (Medical): No    Lack of Transportation (Non-Medical): No  Physical Activity: Not on file  Stress: Not on file  Social Connections: Not on file  Intimate Partner Violence: Not At Risk (10/26/2022)   Humiliation, Afraid, Rape, and Kick questionnaire    Fear of Current or Ex-Partner: No    Emotionally Abused: No  Physically Abused: No    Sexually Abused: No    Family History: Family History  Problem Relation Age of Onset   Hypertension Mother    Heart attack Father    Diabetes Sister    Cancer Sister        breast   Hypertension Brother    Arthritis Brother        rheumatoid   Schizophrenia Son    Colon cancer Neg Hx     Current Medications:  Current Outpatient Medications:    acetaminophen (TYLENOL) 500 MG tablet, Take 500 mg by mouth every 6 (six) hours as needed for moderate pain., Disp: , Rfl:    amLODipine (NORVASC) 5 MG tablet, Take 1 tablet (5 mg total) by mouth daily., Disp: 30 tablet, Rfl: 0   carvedilol (COREG) 25 MG tablet, Take 25 mg by mouth 2 (two) times daily., Disp: , Rfl:    cholecalciferol (VITAMIN D3) 25 MCG (1000 UNIT) tablet, Take 1,000 Units by mouth daily., Disp: , Rfl:    NIFEdipine (PROCARDIA XL/NIFEDICAL XL) 60 MG 24 hr tablet, Take 60 mg by mouth daily., Disp: , Rfl:    Omega-3 Fatty Acids (FISH OIL) 1200 MG CAPS, Take 1,200 mg by mouth daily., Disp: , Rfl:    potassium chloride (MICRO-K) 10 MEQ CR capsule, Take 10 mEq by mouth daily., Disp: , Rfl:    Allergies: Allergies  Allergen Reactions   Crestor [Rosuvastatin] Other (See Comments)    Myalgias, BLE swelling/weakness   Tylenol With  Codeine #3 [Acetaminophen-Codeine] Itching and Rash   Vicodin [Hydrocodone-Acetaminophen] Itching and Rash    REVIEW OF SYSTEMS:   Review of Systems  Constitutional:  Negative for chills, fatigue and fever.  HENT:   Negative for lump/mass, mouth sores, nosebleeds, sore throat and trouble swallowing.   Eyes:  Negative for eye problems.  Respiratory:  Positive for cough. Negative for shortness of breath.   Cardiovascular:  Negative for chest pain, leg swelling and palpitations.  Gastrointestinal:  Positive for constipation. Negative for abdominal pain, diarrhea, nausea and vomiting.  Genitourinary:  Negative for bladder incontinence, difficulty urinating, dysuria, frequency, hematuria and nocturia.   Musculoskeletal:  Negative for arthralgias, back pain, flank pain, myalgias and neck pain.  Skin:  Negative for itching and rash.  Neurological:  Negative for dizziness, headaches and numbness.  Hematological:  Does not bruise/bleed easily.  Psychiatric/Behavioral:  Negative for depression, sleep disturbance and suicidal ideas. The patient is not nervous/anxious.   All other systems reviewed and are negative.    VITALS:   Blood pressure (!) 150/78, pulse 60, temperature 97.9 F (36.6 C), temperature source Tympanic, resp. rate 16, height 5' 6.93" (1.7 m), weight 125 lb 14.4 oz (57.1 kg), SpO2 99%.  Wt Readings from Last 3 Encounters:  10/26/22 125 lb 14.4 oz (57.1 kg)  09/19/22 126 lb 6.4 oz (57.3 kg)  09/07/22 132 lb 12.8 oz (60.2 kg)    Body mass index is 19.76 kg/m.   PHYSICAL EXAM:   Physical Exam Vitals and nursing note reviewed. Exam conducted with a chaperone present.  Constitutional:      Appearance: Normal appearance.  Cardiovascular:     Rate and Rhythm: Normal rate and regular rhythm.     Pulses: Normal pulses.     Heart sounds: Normal heart sounds.  Pulmonary:     Effort: Pulmonary effort is normal.     Breath sounds: Normal breath sounds.  Abdominal:      Palpations: Abdomen is soft. There is  no hepatomegaly, splenomegaly or mass.     Tenderness: There is no abdominal tenderness.  Musculoskeletal:     Right lower leg: No edema.     Left lower leg: No edema.  Lymphadenopathy:     Cervical: No cervical adenopathy.     Right cervical: No superficial, deep or posterior cervical adenopathy.    Left cervical: No superficial, deep or posterior cervical adenopathy.     Upper Body:     Right upper body: No supraclavicular or axillary adenopathy.     Left upper body: No supraclavicular or axillary adenopathy.  Neurological:     General: No focal deficit present.     Mental Status: She is alert and oriented to person, place, and time.  Psychiatric:        Mood and Affect: Mood normal.        Behavior: Behavior normal.     LABS:      Latest Ref Rng & Units 10/26/2022   12:07 PM 08/16/2022    5:29 PM 07/13/2022   11:52 AM  CBC  WBC 4.0 - 10.5 K/uL 6.8  7.0  7.1   Hemoglobin 12.0 - 15.0 g/dL 88.4  16.6  06.3   Hematocrit 36.0 - 46.0 % 41.0  46.5  41.9   Platelets 150 - 400 K/uL 245  158  234       Latest Ref Rng & Units 08/16/2022    5:29 PM  CMP  Glucose 70 - 99 mg/dL 016   BUN 8 - 23 mg/dL 63   Creatinine 0.10 - 1.00 mg/dL 9.32   Sodium 355 - 732 mmol/L 131   Potassium 3.5 - 5.1 mmol/L 2.9   Chloride 98 - 111 mmol/L 97   CO2 22 - 32 mmol/L 26   Calcium 8.9 - 10.3 mg/dL 8.4      No results found for: "CEA1", "CEA" / No results found for: "CEA1", "CEA" No results found for: "PSA1" No results found for: "KGU542" No results found for: "CAN125"  No results found for: "TOTALPROTELP", "ALBUMINELP", "A1GS", "A2GS", "BETS", "BETA2SER", "GAMS", "MSPIKE", "SPEI" No results found for: "TIBC", "FERRITIN", "IRONPCTSAT" Lab Results  Component Value Date   LDH 131 10/26/2022     STUDIES:   No results found.

## 2022-10-26 ENCOUNTER — Inpatient Hospital Stay: Payer: Medicare Other

## 2022-10-26 ENCOUNTER — Inpatient Hospital Stay: Payer: Medicare Other | Attending: Hematology | Admitting: Hematology

## 2022-10-26 ENCOUNTER — Encounter: Payer: Self-pay | Admitting: Hematology

## 2022-10-26 VITALS — BP 150/78 | HR 60 | Temp 97.9°F | Resp 16 | Ht 66.93 in | Wt 125.9 lb

## 2022-10-26 DIAGNOSIS — D72828 Other elevated white blood cell count: Secondary | ICD-10-CM | POA: Diagnosis not present

## 2022-10-26 DIAGNOSIS — R634 Abnormal weight loss: Secondary | ICD-10-CM | POA: Diagnosis not present

## 2022-10-26 DIAGNOSIS — D72825 Bandemia: Secondary | ICD-10-CM

## 2022-10-26 DIAGNOSIS — D72829 Elevated white blood cell count, unspecified: Secondary | ICD-10-CM | POA: Insufficient documentation

## 2022-10-26 LAB — CBC WITH DIFFERENTIAL/PLATELET
Abs Immature Granulocytes: 0.01 10*3/uL (ref 0.00–0.07)
Basophils Absolute: 0 10*3/uL (ref 0.0–0.1)
Basophils Relative: 1 %
Eosinophils Absolute: 0 10*3/uL (ref 0.0–0.5)
Eosinophils Relative: 0 %
HCT: 41 % (ref 36.0–46.0)
Hemoglobin: 13.5 g/dL (ref 12.0–15.0)
Immature Granulocytes: 0 %
Lymphocytes Relative: 27 %
Lymphs Abs: 1.8 10*3/uL (ref 0.7–4.0)
MCH: 29.8 pg (ref 26.0–34.0)
MCHC: 32.9 g/dL (ref 30.0–36.0)
MCV: 90.5 fL (ref 80.0–100.0)
Monocytes Absolute: 0.5 10*3/uL (ref 0.1–1.0)
Monocytes Relative: 8 %
Neutro Abs: 4.4 10*3/uL (ref 1.7–7.7)
Neutrophils Relative %: 64 %
Platelets: 245 10*3/uL (ref 150–400)
RBC: 4.53 MIL/uL (ref 3.87–5.11)
RDW: 14.9 % (ref 11.5–15.5)
WBC: 6.8 10*3/uL (ref 4.0–10.5)
nRBC: 0 % (ref 0.0–0.2)

## 2022-10-26 LAB — LACTATE DEHYDROGENASE: LDH: 131 U/L (ref 98–192)

## 2022-10-26 NOTE — Patient Instructions (Addendum)
[facility]  Discharge Instructions  You were seen and examined today by Dr. Ellin Saba. Dr. Ellin Saba is a hematologist, meaning that he specializes in blood abnormalities. Dr. Ellin Saba discussed your past medical history, family history of cancers/blood conditions and the events that led to you being here today.  You were referred to Dr. Ellin Saba due to elevated white blood cells (leukocytosis) and unexplained weight loss.  Dr. Ellin Saba has recommended additional labs today for further evaluation.  Follow-up as scheduled.  Thank you for choosing [facility] to provide your oncology and hematology care.   To afford each patient quality time with our provider, please arrive at least 15 minutes before your scheduled appointment time. You may need to reschedule your appointment if you arrive late (10 or more minutes). Arriving late affects you and other patients whose appointments are after yours.  Also, if you miss three or more appointments without notifying the office, you may be dismissed from the clinic at the provider's discretion.    Again, thank you for choosing [facility].  Our hope is that these requests will decrease the amount of time that you wait before being seen by our physicians.   If you have a lab appointment with the Cancer Center - please note that after April 8th, all labs will be drawn in the cancer center.  You do not have to check in or register with the main entrance as you have in the past but will complete your check-in at the cancer center.            _____________________________________________________________  Should you have questions after your visit to [facility], please contact our office at [phone] and follow the prompts.  Our office hours are 8:00 a.m. to 4:30 p.m. Monday - Thursday and 8:00 a.m. to 2:30 p.m. Friday.  Please note that voicemails left after 4:00  p.m. may not be returned until the following business day.  We are closed weekends and all major holidays.  You do have access to a nurse 24-7, just call the main number to the clinic [phone] and do not press any options, hold on the line and a nurse will answer the phone.    For prescription refill requests, have your pharmacy contact our office and allow 72 hours.    Masks are no longer required in the cancer centers. If you would like for your care team to wear a mask while they are taking care of you, please let them know. You may have one support person who is at least 74 years old accompany you for your appointments.

## 2022-10-26 NOTE — Progress Notes (Signed)
Message received from Doreatha Massed, MD- Please call and let her know that her CBC from today is normal. Thanks.   Called patient and left detailed message about the CBC from today being normal on [phone] voicemail per patient's chart.

## 2022-11-02 ENCOUNTER — Encounter: Payer: Self-pay | Admitting: Nurse Practitioner

## 2022-11-02 ENCOUNTER — Ambulatory Visit: Payer: Medicare Other | Attending: Nurse Practitioner | Admitting: Nurse Practitioner

## 2022-11-02 VITALS — BP 138/76 | HR 60 | Ht 67.0 in | Wt 125.2 lb

## 2022-11-02 DIAGNOSIS — Z79899 Other long term (current) drug therapy: Secondary | ICD-10-CM

## 2022-11-02 DIAGNOSIS — E785 Hyperlipidemia, unspecified: Secondary | ICD-10-CM | POA: Diagnosis not present

## 2022-11-02 DIAGNOSIS — I10 Essential (primary) hypertension: Secondary | ICD-10-CM

## 2022-11-02 DIAGNOSIS — M7989 Other specified soft tissue disorders: Secondary | ICD-10-CM

## 2022-11-02 DIAGNOSIS — I2510 Atherosclerotic heart disease of native coronary artery without angina pectoris: Secondary | ICD-10-CM | POA: Diagnosis not present

## 2022-11-02 DIAGNOSIS — N184 Chronic kidney disease, stage 4 (severe): Secondary | ICD-10-CM

## 2022-11-02 MED ORDER — EZETIMIBE 10 MG PO TABS: 10.0000 mg | ORAL_TABLET | Freq: Every day | ORAL | 3 refills | Status: DC

## 2022-11-02 NOTE — Patient Instructions (Signed)
Medication Instructions:   Begin Zetia 10mg  daily  Continue all other medications.     Labwork:  FLP, LFT - orders given Please do in 6-8 weeks  Reminder:  Nothing to eat or drink after 12 midnight prior to labs. Office will contact with results via phone, letter or mychart.     Testing/Procedures:  none  Follow-Up:  3 months   Any Other Special Instructions Will Be Listed Below (If Applicable).   If you need a refill on your cardiac medications before your next appointment, please call your pharmacy.

## 2022-11-02 NOTE — Progress Notes (Signed)
Office Visit    Patient Name: Samantha Bruce Date of Encounter: 11/02/2022 PCP:  Practice, [facility]  Cardiologist:  Vishnu Norton Pastel, MD  Advanced Practice Provider:  No care team member to display Electrophysiologist:  None   Chief Complaint and HPI    Samantha Bruce is a 72 y.o. female with a hx of coronary artery calcifications/CAD, hyperlipidemia, hypertension, tobacco abuse, who presents today for scheduled follow-up.  Last seen by Dr. Jenene Slicker on June 11, 2022.  She was pending preoperative clearance for EGD and colonoscopy.  Due to imaging evidence of severe LM and coronary artery calcifications, was referred to cardiology for clearance.  Patient never had prior ischemic evaluation.  She is overall doing well from a cardiac perspective at the time.  Was noted she was smoking 3 cigarettes/day.  Was reported to be very active.  Was started on baby aspirin and rosuvastatin 10 mg nightly.  ED visit 08/16/2022 for LOC and weakness. Had syncopal episode when walking to kitchen, daughter heard her fall to ground. Was felt that this was an orthostatic event, and pt was dehydrated. Found to have AKI, indapamide discontinued, started on amlodipine.   I last saw patient on September 07, 2022 for evaluation for weakness, difficult walking evaluation. Reports myalgias soon after starting Crestor. Stopped after taking for 3 days, legs became weak and noticed pedal/ankle edema. Couldn't fit into her regular shoes, now wearing velcro orthopedic shoes. Crestor was d/c.   Today she presents for follow-up.  She says her leg swelling has resolved and she has not had any more myalgias since stopping Crestor.  She says this is the second time she has had this response to this medication.  From her report, she says she has not tried another statin in the past.  She also has had gout 3 times since Christmas 2023.  She is unsure if her leg edema/weakness was  also due to gout as well. Denies any chest pain, shortness of breath, palpitations, syncope, presyncope, dizziness, orthopnea, PND, swelling or significant weight changes, acute bleeding, or claudication.  EKGs/Labs/Other Studies Reviewed:   The following studies were reviewed today:   EKG:  EKG is not ordered today.    Echo 07/2021:    1. Left ventricular ejection fraction, by estimation, is 60 to 65%. The  left ventricle has normal function. The left ventricle has no regional  wall motion abnormalities. Left ventricular diastolic parameters are  consistent with Grade I diastolic  dysfunction (impaired relaxation). Elevated left atrial pressure.   2. Right ventricular systolic function is normal. The right ventricular  size is normal. Tricuspid regurgitation signal is inadequate for assessing  PA pressure.   3. Left atrial size was mildly dilated.   4. The mitral valve is abnormal. Mild mitral valve regurgitation. No  evidence of mitral stenosis.   5. The aortic valve is tricuspid. There is mild calcification of the  aortic valve. There is mild thickening of the aortic valve. Aortic valve  regurgitation is not visualized. No aortic stenosis is present.   6. The inferior vena cava is normal in size with greater than 50%  respiratory variability, suggesting right atrial pressure of 3 mmHg.    Review of Systems    All other systems reviewed and are otherwise negative except as noted above.  Physical Exam    VS:  BP 138/76 (BP Location: Left Arm)   Pulse 60   Ht 5\' 7"  (1.702 m)  Wt 125 lb 3.2 oz (56.8 kg)   SpO2 96%   BMI 19.61 kg/m  , BMI Body mass index is 19.61 kg/m.  Wt Readings from Last 3 Encounters:  11/02/22 125 lb 3.2 oz (56.8 kg)  10/26/22 125 lb 14.4 oz (57.1 kg)  09/19/22 126 lb 6.4 oz (57.3 kg)     GEN: Well nourished, well developed, in no acute distress. HEENT: normal. Neck: Supple, no JVD, carotid bruits, or masses. Cardiac: S1/S2, RRR, no murmurs,  rubs, or gallops. No clubbing, cyanosis. No edema along BLE Radials/PT 2+ and equal bilaterally.  Respiratory:  Respirations regular and unlabored, clear to auscultation bilaterally. MS: No deformity or atrophy.  Skin: Warm and dry, no rash. Neuro:  Strength and sensation are intact. Psych: Normal affect.  Assessment & Plan    Lower extremity edema Has resolved since stopping Crestor. No edema on exam, pt denies any weakness. At last office visit, I added Crestor to her allergy list.  She has not tried another statin per her report.  We will start Zetia 10 mg daily and repeat FLP and LFT in 6 to 8 weeks per protocol.  Depending on future lab work results, would need to consider possibly low intensity statin to see if she can tolerate this - see below.  No other medication changes. Continue to follow with PCP.  2. CAD, HLD (statin intolerance?), medication management Stable with no anginal symptoms. No indication for ischemic evaluation. Continue Aspirin and Coreg. Could not tolerate Crestor and will start Zetia 10 mg daily and arranging lab work in 6 to 8 weeks as mentioned above.   3. Hypertension BP well controlled. Discussed to monitor BP at home at least 2 hours after medications and sitting for 5-10 minutes. Continue current medication regimen. Has upcoming labs arranged with Nephrology.   4. CKD stage IV Most recent labs revealed improved sCr at 1.89 and eGFR at 28. Avoid nephrotoxic agents. Checking labs as mentioned above. Encouraged adequate hydration and to continue to follow with PCP.and Nephrology.    Disposition: Follow up in 3 months with Vishnu P Mallipeddi, MD or APP.  Signed, Sharlene Dory, NP 11/02/2022, 8:55 AM [facility]

## 2022-11-09 DIAGNOSIS — I129 Hypertensive chronic kidney disease with stage 1 through stage 4 chronic kidney disease, or unspecified chronic kidney disease: Secondary | ICD-10-CM | POA: Diagnosis not present

## 2022-11-09 DIAGNOSIS — I5030 Unspecified diastolic (congestive) heart failure: Secondary | ICD-10-CM | POA: Diagnosis not present

## 2022-11-09 DIAGNOSIS — N183 Chronic kidney disease, stage 3 unspecified: Secondary | ICD-10-CM | POA: Diagnosis not present

## 2022-11-09 DIAGNOSIS — E785 Hyperlipidemia, unspecified: Secondary | ICD-10-CM | POA: Diagnosis not present

## 2022-11-09 DIAGNOSIS — N179 Acute kidney failure, unspecified: Secondary | ICD-10-CM | POA: Diagnosis not present

## 2022-11-09 DIAGNOSIS — E782 Mixed hyperlipidemia: Secondary | ICD-10-CM | POA: Diagnosis not present

## 2022-11-09 DIAGNOSIS — N184 Chronic kidney disease, stage 4 (severe): Secondary | ICD-10-CM | POA: Diagnosis not present

## 2022-11-09 DIAGNOSIS — E876 Hypokalemia: Secondary | ICD-10-CM | POA: Diagnosis not present

## 2022-11-23 ENCOUNTER — Inpatient Hospital Stay: Payer: Medicare Other | Admitting: Oncology

## 2022-11-23 ENCOUNTER — Ambulatory Visit: Payer: Medicare Other | Admitting: Oncology

## 2022-11-23 DIAGNOSIS — N1832 Chronic kidney disease, stage 3b: Secondary | ICD-10-CM | POA: Diagnosis not present

## 2022-11-23 DIAGNOSIS — I5032 Chronic diastolic (congestive) heart failure: Secondary | ICD-10-CM | POA: Diagnosis not present

## 2022-11-23 DIAGNOSIS — E876 Hypokalemia: Secondary | ICD-10-CM | POA: Diagnosis not present

## 2022-11-23 DIAGNOSIS — I129 Hypertensive chronic kidney disease with stage 1 through stage 4 chronic kidney disease, or unspecified chronic kidney disease: Secondary | ICD-10-CM | POA: Diagnosis not present

## 2022-12-04 ENCOUNTER — Encounter: Payer: Self-pay | Admitting: Gastroenterology

## 2022-12-12 ENCOUNTER — Institutional Professional Consult (permissible substitution): Payer: Medicare Other | Admitting: Diagnostic Neuroimaging

## 2022-12-12 ENCOUNTER — Telehealth: Payer: Self-pay | Admitting: Diagnostic Neuroimaging

## 2022-12-12 NOTE — Telephone Encounter (Signed)
Pt's sister called stating that the pt is refusing to come to her appt today.

## 2022-12-12 NOTE — Telephone Encounter (Signed)
Phone staff also offered a reschedule appt which the pt declined as well.

## 2023-01-18 DIAGNOSIS — I10 Essential (primary) hypertension: Secondary | ICD-10-CM | POA: Diagnosis not present

## 2023-01-18 DIAGNOSIS — E039 Hypothyroidism, unspecified: Secondary | ICD-10-CM | POA: Diagnosis not present

## 2023-01-18 DIAGNOSIS — N184 Chronic kidney disease, stage 4 (severe): Secondary | ICD-10-CM | POA: Diagnosis not present

## 2023-01-18 DIAGNOSIS — J449 Chronic obstructive pulmonary disease, unspecified: Secondary | ICD-10-CM | POA: Diagnosis not present

## 2023-01-18 DIAGNOSIS — E876 Hypokalemia: Secondary | ICD-10-CM | POA: Diagnosis not present

## 2023-01-25 ENCOUNTER — Other Ambulatory Visit (HOSPITAL_COMMUNITY): Payer: Self-pay | Admitting: Internal Medicine

## 2023-01-25 DIAGNOSIS — Z1231 Encounter for screening mammogram for malignant neoplasm of breast: Secondary | ICD-10-CM

## 2023-02-01 ENCOUNTER — Encounter: Payer: Self-pay | Admitting: Nurse Practitioner

## 2023-02-01 ENCOUNTER — Ambulatory Visit: Payer: Medicare Other | Attending: Nurse Practitioner | Admitting: Nurse Practitioner

## 2023-02-01 VITALS — BP 110/60 | HR 69 | Ht 66.0 in | Wt 136.0 lb

## 2023-02-01 DIAGNOSIS — I10 Essential (primary) hypertension: Secondary | ICD-10-CM

## 2023-02-01 DIAGNOSIS — N184 Chronic kidney disease, stage 4 (severe): Secondary | ICD-10-CM

## 2023-02-01 DIAGNOSIS — E785 Hyperlipidemia, unspecified: Secondary | ICD-10-CM

## 2023-02-01 DIAGNOSIS — I2510 Atherosclerotic heart disease of native coronary artery without angina pectoris: Secondary | ICD-10-CM

## 2023-02-01 NOTE — Patient Instructions (Addendum)

## 2023-02-01 NOTE — Progress Notes (Unsigned)
Office Visit    Patient Name: Samantha Bruce Date of Encounter: 11/02/2022 PCP:  Practice, [facility]  Cardiologist:  Vishnu Norton Pastel, MD  Advanced Practice Provider:  No care team member to display Electrophysiologist:  None   Chief Complaint and HPI    Samantha Bruce is a 72 y.o. female with a hx of coronary artery calcifications/CAD, hyperlipidemia, hypertension, tobacco abuse, who presents today for scheduled follow-up.  Last seen by Dr. Jenene Slicker on June 11, 2022.  She was pending preoperative clearance for EGD and colonoscopy.  Due to imaging evidence of severe LM and coronary artery calcifications, was referred to cardiology for clearance.  Patient never had prior ischemic evaluation.  She is overall doing well from a cardiac perspective at the time.  Was noted she was smoking 3 cigarettes/day.  Was reported to be very active.  Was started on baby aspirin and rosuvastatin 10 mg nightly.  ED visit 08/16/2022 for LOC and weakness. Had syncopal episode when walking to kitchen, daughter heard her fall to ground. Was felt that this was an orthostatic event, and pt was dehydrated. Found to have AKI, indapamide discontinued, started on amlodipine.   I last saw patient on September 07, 2022 for evaluation for weakness, difficult walking evaluation. Reports myalgias soon after starting Crestor. Stopped after taking for 3 days, legs became weak and noticed pedal/ankle edema. Couldn't fit into her regular shoes, now wearing velcro orthopedic shoes. Crestor was d/c.   Today she presents for follow-up.  She says her leg swelling has resolved and she has not had any more myalgias since stopping Crestor.  She says this is the second time she has had this response to this medication.  From her report, she says she has not tried another statin in the past.  She also has had gout 3 times since Christmas 2023.  She is unsure if her leg edema/weakness was  also due to gout as well. Denies any chest pain, shortness of breath, palpitations, syncope, presyncope, dizziness, orthopnea, PND, swelling or significant weight changes, acute bleeding, or claudication.   Zetia - makes her dizzy.  Dizziness is better.   Request labs from Dr. Mayford Knife.     EKGs/Labs/Other Studies Reviewed:   The following studies were reviewed today:   EKG:  EKG is not ordered today.    Echo 07/2021:    1. Left ventricular ejection fraction, by estimation, is 60 to 65%. The  left ventricle has normal function. The left ventricle has no regional  wall motion abnormalities. Left ventricular diastolic parameters are  consistent with Grade I diastolic  dysfunction (impaired relaxation). Elevated left atrial pressure.   2. Right ventricular systolic function is normal. The right ventricular  size is normal. Tricuspid regurgitation signal is inadequate for assessing  PA pressure.   3. Left atrial size was mildly dilated.   4. The mitral valve is abnormal. Mild mitral valve regurgitation. No  evidence of mitral stenosis.   5. The aortic valve is tricuspid. There is mild calcification of the  aortic valve. There is mild thickening of the aortic valve. Aortic valve  regurgitation is not visualized. No aortic stenosis is present.   6. The inferior vena cava is normal in size with greater than 50%  respiratory variability, suggesting right atrial pressure of 3 mmHg.    Review of Systems    All other systems reviewed and are otherwise negative except as noted above.  Physical Exam  VS:  There were no vitals taken for this visit. , BMI There is no height or weight on file to calculate BMI.  Wt Readings from Last 3 Encounters:  11/02/22 125 lb 3.2 oz (56.8 kg)  10/26/22 125 lb 14.4 oz (57.1 kg)  09/19/22 126 lb 6.4 oz (57.3 kg)     GEN: Well nourished, well developed, in no acute distress. HEENT: normal. Neck: Supple, no JVD, carotid bruits, or  masses. Cardiac: S1/S2, RRR, no murmurs, rubs, or gallops. No clubbing, cyanosis. No edema along BLE Radials/PT 2+ and equal bilaterally.  Respiratory:  Respirations regular and unlabored, clear to auscultation bilaterally. MS: No deformity or atrophy.  Skin: Warm and dry, no rash. Neuro:  Strength and sensation are intact. Psych: Normal affect.  Assessment & Plan    Lower extremity edema Has resolved since stopping Crestor. No edema on exam, pt denies any weakness. At last office visit, I added Crestor to her allergy list.  She has not tried another statin per her report.  We will start Zetia 10 mg daily and repeat FLP and LFT in 6 to 8 weeks per protocol.  Depending on future lab work results, would need to consider possibly low intensity statin to see if she can tolerate this - see below.  No other medication changes. Continue to follow with PCP.  2. CAD, HLD (statin intolerance?), medication management Stable with no anginal symptoms. No indication for ischemic evaluation. Continue Aspirin and Coreg. Could not tolerate Crestor and will start Zetia 10 mg daily and arranging lab work in 6 to 8 weeks as mentioned above.   3. Hypertension BP well controlled. Discussed to monitor BP at home at least 2 hours after medications and sitting for 5-10 minutes. Continue current medication regimen. Has upcoming labs arranged with Nephrology.   4. CKD stage IV Most recent labs revealed improved sCr at 1.89 and eGFR at 28. Avoid nephrotoxic agents. Checking labs as mentioned above. Encouraged adequate hydration and to continue to follow with PCP.and Nephrology.    Disposition: Follow up in 3 months with Vishnu P Mallipeddi, MD or APP.  Signed, Sharlene Dory, NP 02/01/2023, 8:35 AM [facility]

## 2023-02-08 ENCOUNTER — Ambulatory Visit (HOSPITAL_COMMUNITY)
Admission: RE | Admit: 2023-02-08 | Discharge: 2023-02-08 | Disposition: A | Discharge status: Home / Self Care | Payer: Medicare Other | Source: Ambulatory Visit | Attending: Internal Medicine | Admitting: Internal Medicine

## 2023-02-08 ENCOUNTER — Encounter (HOSPITAL_COMMUNITY): Payer: Self-pay

## 2023-02-08 DIAGNOSIS — R634 Abnormal weight loss: Secondary | ICD-10-CM | POA: Diagnosis not present

## 2023-02-08 DIAGNOSIS — S161XXA Strain of muscle, fascia and tendon at neck level, initial encounter: Secondary | ICD-10-CM | POA: Diagnosis not present

## 2023-02-08 DIAGNOSIS — R413 Other amnesia: Secondary | ICD-10-CM | POA: Diagnosis not present

## 2023-02-08 DIAGNOSIS — Z1231 Encounter for screening mammogram for malignant neoplasm of breast: Secondary | ICD-10-CM | POA: Insufficient documentation

## 2023-02-08 DIAGNOSIS — R918 Other nonspecific abnormal finding of lung field: Secondary | ICD-10-CM | POA: Diagnosis not present

## 2023-02-08 DIAGNOSIS — R030 Elevated blood-pressure reading, without diagnosis of hypertension: Secondary | ICD-10-CM | POA: Diagnosis not present

## 2023-02-08 DIAGNOSIS — R42 Dizziness and giddiness: Secondary | ICD-10-CM | POA: Diagnosis not present

## 2023-02-08 DIAGNOSIS — I2510 Atherosclerotic heart disease of native coronary artery without angina pectoris: Secondary | ICD-10-CM | POA: Diagnosis not present

## 2023-02-08 DIAGNOSIS — F17210 Nicotine dependence, cigarettes, uncomplicated: Secondary | ICD-10-CM | POA: Diagnosis not present

## 2023-02-08 DIAGNOSIS — H9202 Otalgia, left ear: Secondary | ICD-10-CM | POA: Diagnosis not present

## 2023-02-08 DIAGNOSIS — J449 Chronic obstructive pulmonary disease, unspecified: Secondary | ICD-10-CM | POA: Diagnosis not present

## 2023-02-08 DIAGNOSIS — J40 Bronchitis, not specified as acute or chronic: Secondary | ICD-10-CM | POA: Diagnosis not present

## 2023-02-08 DIAGNOSIS — R109 Unspecified abdominal pain: Secondary | ICD-10-CM | POA: Diagnosis not present

## 2023-02-22 DIAGNOSIS — I129 Hypertensive chronic kidney disease with stage 1 through stage 4 chronic kidney disease, or unspecified chronic kidney disease: Secondary | ICD-10-CM | POA: Diagnosis not present

## 2023-02-22 DIAGNOSIS — I5032 Chronic diastolic (congestive) heart failure: Secondary | ICD-10-CM | POA: Diagnosis not present

## 2023-02-22 DIAGNOSIS — N184 Chronic kidney disease, stage 4 (severe): Secondary | ICD-10-CM | POA: Diagnosis not present

## 2023-02-22 DIAGNOSIS — N1832 Chronic kidney disease, stage 3b: Secondary | ICD-10-CM | POA: Diagnosis not present

## 2023-03-01 DIAGNOSIS — R809 Proteinuria, unspecified: Secondary | ICD-10-CM | POA: Diagnosis not present

## 2023-03-01 DIAGNOSIS — I129 Hypertensive chronic kidney disease with stage 1 through stage 4 chronic kidney disease, or unspecified chronic kidney disease: Secondary | ICD-10-CM | POA: Diagnosis not present

## 2023-03-01 DIAGNOSIS — N1832 Chronic kidney disease, stage 3b: Secondary | ICD-10-CM | POA: Diagnosis not present

## 2023-03-01 DIAGNOSIS — I5032 Chronic diastolic (congestive) heart failure: Secondary | ICD-10-CM | POA: Diagnosis not present

## 2023-03-22 DIAGNOSIS — S92355A Nondisplaced fracture of fifth metatarsal bone, left foot, initial encounter for closed fracture: Secondary | ICD-10-CM | POA: Diagnosis not present

## 2023-03-22 DIAGNOSIS — S93402A Sprain of unspecified ligament of left ankle, initial encounter: Secondary | ICD-10-CM | POA: Diagnosis not present

## 2023-04-08 DIAGNOSIS — M25572 Pain in left ankle and joints of left foot: Secondary | ICD-10-CM | POA: Diagnosis not present

## 2023-04-08 DIAGNOSIS — M79672 Pain in left foot: Secondary | ICD-10-CM | POA: Diagnosis not present

## 2023-04-08 DIAGNOSIS — S93402A Sprain of unspecified ligament of left ankle, initial encounter: Secondary | ICD-10-CM | POA: Diagnosis not present

## 2023-05-17 DIAGNOSIS — N189 Chronic kidney disease, unspecified: Secondary | ICD-10-CM | POA: Diagnosis not present

## 2023-05-17 DIAGNOSIS — E559 Vitamin D deficiency, unspecified: Secondary | ICD-10-CM | POA: Diagnosis not present

## 2023-05-17 DIAGNOSIS — R809 Proteinuria, unspecified: Secondary | ICD-10-CM | POA: Diagnosis not present

## 2023-05-17 DIAGNOSIS — D631 Anemia in chronic kidney disease: Secondary | ICD-10-CM | POA: Diagnosis not present

## 2023-05-24 DIAGNOSIS — I5032 Chronic diastolic (congestive) heart failure: Secondary | ICD-10-CM | POA: Diagnosis not present

## 2023-05-24 DIAGNOSIS — N184 Chronic kidney disease, stage 4 (severe): Secondary | ICD-10-CM | POA: Diagnosis not present

## 2023-05-24 DIAGNOSIS — R809 Proteinuria, unspecified: Secondary | ICD-10-CM | POA: Diagnosis not present

## 2023-05-24 DIAGNOSIS — I129 Hypertensive chronic kidney disease with stage 1 through stage 4 chronic kidney disease, or unspecified chronic kidney disease: Secondary | ICD-10-CM | POA: Diagnosis not present

## 2023-06-14 DIAGNOSIS — I129 Hypertensive chronic kidney disease with stage 1 through stage 4 chronic kidney disease, or unspecified chronic kidney disease: Secondary | ICD-10-CM | POA: Diagnosis not present

## 2023-06-14 DIAGNOSIS — N184 Chronic kidney disease, stage 4 (severe): Secondary | ICD-10-CM | POA: Diagnosis not present

## 2023-06-14 DIAGNOSIS — Z1329 Encounter for screening for other suspected endocrine disorder: Secondary | ICD-10-CM | POA: Diagnosis not present

## 2023-06-14 DIAGNOSIS — Z1321 Encounter for screening for nutritional disorder: Secondary | ICD-10-CM | POA: Diagnosis not present

## 2023-06-14 DIAGNOSIS — E785 Hyperlipidemia, unspecified: Secondary | ICD-10-CM | POA: Diagnosis not present

## 2023-06-14 DIAGNOSIS — I10 Essential (primary) hypertension: Secondary | ICD-10-CM | POA: Diagnosis not present

## 2023-06-14 DIAGNOSIS — N189 Chronic kidney disease, unspecified: Secondary | ICD-10-CM | POA: Diagnosis not present

## 2023-06-21 DIAGNOSIS — Z6822 Body mass index (BMI) 22.0-22.9, adult: Secondary | ICD-10-CM | POA: Diagnosis not present

## 2023-06-21 DIAGNOSIS — Z1389 Encounter for screening for other disorder: Secondary | ICD-10-CM | POA: Diagnosis not present

## 2023-06-21 DIAGNOSIS — Z720 Tobacco use: Secondary | ICD-10-CM | POA: Diagnosis not present

## 2023-06-21 DIAGNOSIS — Z0000 Encounter for general adult medical examination without abnormal findings: Secondary | ICD-10-CM | POA: Diagnosis not present

## 2023-06-21 DIAGNOSIS — I2510 Atherosclerotic heart disease of native coronary artery without angina pectoris: Secondary | ICD-10-CM | POA: Diagnosis not present

## 2023-06-21 DIAGNOSIS — E785 Hyperlipidemia, unspecified: Secondary | ICD-10-CM | POA: Diagnosis not present

## 2023-07-05 DIAGNOSIS — F17210 Nicotine dependence, cigarettes, uncomplicated: Secondary | ICD-10-CM | POA: Diagnosis not present

## 2023-08-02 ENCOUNTER — Ambulatory Visit: Payer: Medicare Other | Admitting: Nurse Practitioner

## 2023-08-28 DIAGNOSIS — D23121 Other benign neoplasm of skin of left upper eyelid, including canthus: Secondary | ICD-10-CM | POA: Diagnosis not present

## 2023-08-28 DIAGNOSIS — Z961 Presence of intraocular lens: Secondary | ICD-10-CM | POA: Diagnosis not present

## 2023-08-28 DIAGNOSIS — H1045 Other chronic allergic conjunctivitis: Secondary | ICD-10-CM | POA: Diagnosis not present

## 2023-08-28 DIAGNOSIS — H43392 Other vitreous opacities, left eye: Secondary | ICD-10-CM | POA: Diagnosis not present

## 2023-08-30 DIAGNOSIS — R0989 Other specified symptoms and signs involving the circulatory and respiratory systems: Secondary | ICD-10-CM | POA: Diagnosis not present

## 2023-08-30 DIAGNOSIS — I2510 Atherosclerotic heart disease of native coronary artery without angina pectoris: Secondary | ICD-10-CM | POA: Diagnosis not present

## 2023-08-30 DIAGNOSIS — I6523 Occlusion and stenosis of bilateral carotid arteries: Secondary | ICD-10-CM | POA: Diagnosis not present

## 2023-08-30 DIAGNOSIS — Z6823 Body mass index (BMI) 23.0-23.9, adult: Secondary | ICD-10-CM | POA: Diagnosis not present

## 2023-08-30 DIAGNOSIS — Z720 Tobacco use: Secondary | ICD-10-CM | POA: Diagnosis not present

## 2023-08-30 DIAGNOSIS — I2584 Coronary atherosclerosis due to calcified coronary lesion: Secondary | ICD-10-CM | POA: Diagnosis not present

## 2023-09-19 DIAGNOSIS — R42 Dizziness and giddiness: Secondary | ICD-10-CM | POA: Diagnosis not present

## 2023-09-19 DIAGNOSIS — N184 Chronic kidney disease, stage 4 (severe): Secondary | ICD-10-CM | POA: Diagnosis not present

## 2023-09-19 DIAGNOSIS — E559 Vitamin D deficiency, unspecified: Secondary | ICD-10-CM | POA: Diagnosis not present

## 2023-09-19 DIAGNOSIS — E785 Hyperlipidemia, unspecified: Secondary | ICD-10-CM | POA: Diagnosis not present

## 2023-09-19 DIAGNOSIS — D631 Anemia in chronic kidney disease: Secondary | ICD-10-CM | POA: Diagnosis not present

## 2023-09-19 DIAGNOSIS — Z0001 Encounter for general adult medical examination with abnormal findings: Secondary | ICD-10-CM | POA: Diagnosis not present

## 2023-09-25 DIAGNOSIS — H26492 Other secondary cataract, left eye: Secondary | ICD-10-CM | POA: Diagnosis not present

## 2023-09-27 DIAGNOSIS — I5032 Chronic diastolic (congestive) heart failure: Secondary | ICD-10-CM | POA: Diagnosis not present

## 2023-09-27 DIAGNOSIS — E785 Hyperlipidemia, unspecified: Secondary | ICD-10-CM | POA: Diagnosis not present

## 2023-09-27 DIAGNOSIS — E876 Hypokalemia: Secondary | ICD-10-CM | POA: Diagnosis not present

## 2023-09-27 DIAGNOSIS — Z6823 Body mass index (BMI) 23.0-23.9, adult: Secondary | ICD-10-CM | POA: Diagnosis not present

## 2023-09-27 DIAGNOSIS — N184 Chronic kidney disease, stage 4 (severe): Secondary | ICD-10-CM | POA: Diagnosis not present

## 2023-09-27 DIAGNOSIS — I2510 Atherosclerotic heart disease of native coronary artery without angina pectoris: Secondary | ICD-10-CM | POA: Diagnosis not present

## 2023-09-27 DIAGNOSIS — I129 Hypertensive chronic kidney disease with stage 1 through stage 4 chronic kidney disease, or unspecified chronic kidney disease: Secondary | ICD-10-CM | POA: Diagnosis not present

## 2023-09-27 DIAGNOSIS — Z720 Tobacco use: Secondary | ICD-10-CM | POA: Diagnosis not present

## 2023-09-30 ENCOUNTER — Other Ambulatory Visit: Payer: Self-pay

## 2023-09-30 ENCOUNTER — Encounter: Payer: Self-pay | Admitting: Surgery

## 2023-09-30 ENCOUNTER — Ambulatory Visit: Attending: Surgery | Admitting: Surgery

## 2023-09-30 VITALS — BP 177/83 | HR 59 | Temp 97.8°F | Ht 66.0 in | Wt 150.0 lb

## 2023-09-30 DIAGNOSIS — I6523 Occlusion and stenosis of bilateral carotid arteries: Secondary | ICD-10-CM

## 2023-09-30 NOTE — Progress Notes (Signed)
 Vascular and Vein Specialist of   Patient name: Samantha Bruce MRN: [id number] DOB: 01-24-1951 Sex: female   REQUESTING PROVIDER:    Vaughn williams   REASON FOR CONSULT:    Carotid stenosis  HISTORY OF PRESENT ILLNESS:   Samantha Bruce is a 73 y.o. female, who is referred for carotid stenosis.  This was detected on ultrasound after a carotid bruit was auscultated.  She is asymptomatic.  Specifically, she denies numbness or weakness in either extremity.  She denies slurred speech.  She denies amaurosis fugax.  Patient is a smoker with COPD.  She is medically managed for hypertension.  She has a history of gout.  She has been intolerant of statins.  PAST MEDICAL HISTORY    Past Medical History:  Diagnosis Date   Anxiety    Chronic kidney disease    COPD (chronic obstructive pulmonary disease) (HCC)    Hyperlipidemia    Hypertension    Vertigo    Vitamin D deficiency      FAMILY HISTORY   Family History  Problem Relation Age of Onset   Hypertension Mother    Heart attack Father    Diabetes Sister    Cancer Sister        breast   Hypertension Brother    Arthritis Brother        rheumatoid   Schizophrenia Son    Colon cancer Neg Hx     SOCIAL HISTORY:   Social History   Socioeconomic History   Marital status: Single    Spouse name: Not on file   Number of children: Not on file   Years of education: Not on file   Highest education level: Not on file  Occupational History   Not on file  Tobacco Use   Smoking status: Light Smoker    Current packs/day: 1.00    Average packs/day: 1 pack/day for 57.5 years (57.5 ttl pk-yrs)    Types: Cigarettes    Start date: 04/16/1966   Smokeless tobacco: Never   Tobacco comments:    1-2 cigarettes per day     Verified by [facility] 07/13/2022  Vaping Use   Vaping status: Never Used  Substance and Sexual Activity   Alcohol  use: No   Drug use: No   Sexual activity: Not  Currently  Other Topics Concern   Not on file  Social History Narrative   Lives at home. Retired.  Education 11th grade.  4 Children.  Caffeine decaf coffee 2 cups daily.   Social Drivers of Corporate investment banker Strain: Not on file  Food Insecurity: No Food Insecurity (10/26/2022)   Hunger Vital Sign    Worried About Running Out of Food in the Last Year: Never true    Ran Out of Food in the Last Year: Never true  Transportation Needs: No Transportation Needs (10/26/2022)   PRAPARE - Administrator, Civil Service (Medical): No    Lack of Transportation (Non-Medical): No  Physical Activity: Not on file  Stress: Not on file  Social Connections: Not on file  Intimate Partner Violence: Not At Risk (10/26/2022)   Humiliation, Afraid, Rape, and Kick questionnaire    Fear of Current or Ex-Partner: No    Emotionally Abused: No    Physically Abused: No    Sexually Abused: No    ALLERGIES:    Allergies  Allergen Reactions   Crestor  [Rosuvastatin ] Other (See Comments)    Myalgias, BLE swelling/weakness   Zetia  [  Ezetimibe ] Other (See Comments)    Dizziness   Tylenol  With Codeine  #3 [Acetaminophen -Codeine ] Itching and Rash   Vicodin [Hydrocodone-Acetaminophen ] Itching and Rash    CURRENT MEDICATIONS:    Current Outpatient Medications  Medication Sig Dispense Refill   prednisoLONE acetate (PRED FORTE) 1 % ophthalmic suspension Place 1 drop into the left eye 4 (four) times daily.     acetaminophen  (TYLENOL ) 500 MG tablet Take 500 mg by mouth every 6 (six) hours as needed for moderate pain.     allopurinol (ZYLOPRIM) 100 MG tablet Take 100 mg by mouth daily.     carvedilol (COREG) 25 MG tablet Take 25 mg by mouth 2 (two) times daily.     cholecalciferol (VITAMIN D3) 25 MCG (1000 UNIT) tablet Take 1,000 Units by mouth daily.     ezetimibe  (ZETIA ) 10 MG tablet Take 1 tablet by mouth daily.     indapamide (LOZOL) 2.5 MG tablet Take 5 mg by mouth daily.     NIFEdipine  (PROCARDIA XL/NIFEDICAL XL) 60 MG 24 hr tablet Take 60 mg by mouth daily.     olmesartan (BENICAR) 5 MG tablet Take 5 mg by mouth daily.     Omega-3 Fatty Acids (FISH OIL) 1200 MG CAPS Take 1,200 mg by mouth daily.     potassium chloride  (MICRO-K ) 10 MEQ CR capsule Take 10 mEq by mouth daily.     No current facility-administered medications for this visit.    REVIEW OF SYSTEMS:   [X]  denotes positive finding, [ ]  denotes negative finding Cardiac  Comments:  Chest pain or chest pressure:    Shortness of breath upon exertion:    Short of breath when lying flat:    Irregular heart rhythm:        Vascular    Pain in calf, thigh, or hip brought on by ambulation:    Pain in feet at night that wakes you up from your sleep:     Blood clot in your veins:    Leg swelling:         Pulmonary    Oxygen at home:    Productive cough:     Wheezing:         Neurologic    Sudden weakness in arms or legs:     Sudden numbness in arms or legs:     Sudden onset of difficulty speaking or slurred speech:    Temporary loss of vision in one eye:     Problems with dizziness:         Gastrointestinal    Blood in stool:      Vomited blood:         Genitourinary    Burning when urinating:     Blood in urine:        Psychiatric    Major depression:         Hematologic    Bleeding problems:    Problems with blood clotting too easily:        Skin    Rashes or ulcers:        Constitutional    Fever or chills:     PHYSICAL EXAM:   Vitals:   09/30/23 1004  BP: (!) 177/83  Pulse: (!) 59  Temp: 97.8 F (36.6 C)  SpO2: 96%  Weight: 150 lb (68 kg)  Height: 5' 6 (1.676 m)    GENERAL: The patient is a well-nourished female, in no acute distress. The vital signs are documented above. CARDIAC: There  is a regular rate and rhythm.  VASCULAR: Nonpalpable pedal pulses.  Left carotid bruit. PULMONARY: Nonlabored respirations ABDOMEN: Soft and non-tender.  No pulsatile mass MUSCULOSKELETAL:  There are no major deformities or cyanosis. NEUROLOGIC: No focal weakness or paresthesias are detected. SKIN: There are no ulcers or rashes noted. PSYCHIATRIC: The patient has a normal affect.  STUDIES:   I reviewed her outside report of her carotid duplex with the following results: Atherosclerotic plaque in the carotid arteries bilaterally, with elevated peak systolic velocities in the internal carotid arteries bilaterally, left greater than right, corresponding to greater than 70% stenosis.   ASSESSMENT and PLAN   Carotid: The patient has asymptomatic bilateral stenosis.  I discussed that ultrasound findings may be inaccurate with bilateral high-grade lesions.  Therefore, I think that the neck step is to get a CT angiogram.  This will help me to better identify her stenosis and also determine if she is greater than 80%, the most appropriate strategy for intervention either endarterectomy or TCAR.  I discussed the importance of medical management including blood pressure control, smoking cessation, aspirin  therapy, and cholesterol management, preferably with a statin.  She will return in 3 to 4 weeks and we will go over her CT angiogram of the neck.   Malvina Serene CLORE, MD, FACS [facility] Specialists of [facility] [phone] Pager [phone]

## 2023-10-11 ENCOUNTER — Ambulatory Visit: Attending: Nurse Practitioner | Admitting: Nurse Practitioner

## 2023-10-11 ENCOUNTER — Encounter: Payer: Self-pay | Admitting: Nurse Practitioner

## 2023-10-11 VITALS — BP 126/70 | HR 59 | Ht 66.0 in | Wt 152.4 lb

## 2023-10-11 DIAGNOSIS — I2510 Atherosclerotic heart disease of native coronary artery without angina pectoris: Secondary | ICD-10-CM

## 2023-10-11 DIAGNOSIS — N184 Chronic kidney disease, stage 4 (severe): Secondary | ICD-10-CM

## 2023-10-11 DIAGNOSIS — E785 Hyperlipidemia, unspecified: Secondary | ICD-10-CM

## 2023-10-11 DIAGNOSIS — I10 Essential (primary) hypertension: Secondary | ICD-10-CM | POA: Diagnosis not present

## 2023-10-11 NOTE — Progress Notes (Signed)
 Office Visit    Patient Name: Samantha Bruce Date of Encounter: 10/11/2023 PCP:  Practice, [facility]  Cardiologist:  Diannah SHAUNNA Maywood, MD  Advanced Practice Provider:  No care team member to display Electrophysiologist:  None   Chief Complaint and HPI    Samantha Bruce is a 73 y.o. female with a hx of coronary artery calcifications/CAD, hyperlipidemia, hypertension, tobacco abuse, who presents today for scheduled follow-up.  Last seen by Dr. Mallipeddi on June 11, 2022.  She was pending preoperative clearance for EGD and colonoscopy.  Due to imaging evidence of severe LM and coronary artery calcifications, was referred to cardiology for clearance.  Patient never had prior ischemic evaluation.  She is overall doing well from a cardiac perspective at the time.  Was noted she was smoking 3 cigarettes/day.  Was reported to be very active.  Was started on baby aspirin  and rosuvastatin  10 mg nightly.  ED visit 08/16/2022 for LOC and weakness. Had syncopal episode when walking to kitchen, daughter heard her fall to ground. Was felt that this was an orthostatic event, and pt was dehydrated. Found to have AKI, indapamide discontinued, started on amlodipine .   I last saw patient on November 02, 2022. Said her leg swelling had resolved and she had not had any more myalgias since stopping Crestor . Reported having gout 3 times since Christmas 2023. Denied any chest pain, shortness of breath, palpitations, syncope, presyncope, dizziness, orthopnea, PND, swelling or significant weight changes, acute bleeding, or claudication.  02/01/2023 - Today she presents for follow-up. Says she could not tolerate Zetia  since last office visit, medication made her dizzy. Since stopping medication, she has not had any more dizziness. Doing well from a cardiac perspective. Denies any chest pain, shortness of breath, palpitations, syncope, presyncope, dizziness,  orthopnea, PND, swelling or significant weight changes, acute bleeding, or claudication.  10/11/2023 -  Here for follow-up. Denies any acute cardiac complaints or issues. Denies any chest pain, shortness of breath, palpitations, syncope, presyncope, dizziness, orthopnea, PND, swelling or significant weight changes, acute bleeding, or claudication.  ROS: Negative. See HPI.   EKGs/Labs/Other Studies Reviewed:   The following studies were reviewed today:   EKG:  EKG Interpretation Date/Time:  Friday October 11 2023 11:29:05 EDT Ventricular Rate:  54 PR Interval:  158 QRS Duration:  80 QT Interval:  398 QTC Calculation: 377 R Axis:   58  Text Interpretation: Sinus bradycardia When compared with ECG of 16-Aug-2022 17:22, PREVIOUS ECG IS PRESENT Confirmed by Miriam Norris [phone]) on 10/11/2023 12:04:16 PM    Echo 07/2021:    1. Left ventricular ejection fraction, by estimation, is 60 to 65%. The  left ventricle has normal function. The left ventricle has no regional  wall motion abnormalities. Left ventricular diastolic parameters are  consistent with Grade I diastolic  dysfunction (impaired relaxation). Elevated left atrial pressure.   2. Right ventricular systolic function is normal. The right ventricular  size is normal. Tricuspid regurgitation signal is inadequate for assessing  PA pressure.   3. Left atrial size was mildly dilated.   4. The mitral valve is abnormal. Mild mitral valve regurgitation. No  evidence of mitral stenosis.   5. The aortic valve is tricuspid. There is mild calcification of the  aortic valve. There is mild thickening of the aortic valve. Aortic valve  regurgitation is not visualized. No aortic stenosis is present.   6. The inferior vena cava is normal in size with greater than  50%  respiratory variability, suggesting right atrial pressure of 3 mmHg.    Review of Systems    All other systems reviewed and are otherwise negative except as noted  above.  Physical Exam    VS:  BP 126/70   Pulse (!) 59   Ht 5' 6 (1.676 m)   Wt 152 lb 6.4 oz (69.1 kg)   SpO2 96%   BMI 24.60 kg/m  , BMI Body mass index is 24.6 kg/m.  Wt Readings from Last 3 Encounters:  10/11/23 152 lb 6.4 oz (69.1 kg)  09/30/23 150 lb (68 kg)  02/01/23 136 lb (61.7 kg)    GEN: Well nourished, well developed, in no acute distress. HEENT: normal. Neck: Supple, no JVD, carotid bruits, or masses. Cardiac: S1/S2, RRR, no murmurs, rubs, or gallops. No clubbing, cyanosis. No edema along BLE Radials/PT 2+ and equal bilaterally.  Respiratory:  Respirations regular and unlabored, clear to auscultation bilaterally. Strong, nonproductive cough.  MS: No deformity or atrophy.  Skin: Warm and dry, no rash. Neuro:  Strength and sensation are intact. Psych: Normal affect.  Assessment & Plan    1. CAD Stable with no anginal symptoms. No indication for ischemic evaluation. Continue current medication regimen. Could not tolerate Crestor  and Zetia . Requesting labs as mentioned below. Heart healthy diet and regular cardiovascular exercise encouraged.   2. Hypertension BP well controlled. Discussed to monitor BP at home at least 2 hours after medications and sitting for 5-10 minutes. Continue current medication regimen.   3. HLD Intolerance to Crestor  and Zetia .  Will request labs from Dr. Trudy. Heart healthy diet and regular cardiovascular exercise encouraged. Plan to consider PCSK9i if labs revealed LDL not at goal.   4. CKD stage IV No recent labs on file. Avoid nephrotoxic agents. No medication changes at this time. Encouraged adequate hydration and to continue to follow with PCP and Nephrology. Will request labs from Dr. Trudy.   I spent a total duration of 30 minutes reviewing prior notes, reviewing outside records including  labs, EKG today, face-to-face counseling of  medical condition, pathophysiology, evaluation, management, and documenting the findings in  the note.   Disposition: Follow up in 6 months with Vishnu P Mallipeddi, MD or APP.  Signed, Almarie Crate, NP

## 2023-10-11 NOTE — Patient Instructions (Addendum)

## 2023-11-01 ENCOUNTER — Encounter (HOSPITAL_COMMUNITY): Payer: Self-pay

## 2023-11-01 ENCOUNTER — Other Ambulatory Visit: Payer: Self-pay

## 2023-11-01 ENCOUNTER — Ambulatory Visit (HOSPITAL_COMMUNITY)
Admission: RE | Admit: 2023-11-01 | Discharge: 2023-11-01 | Disposition: A | Discharge status: Home / Self Care | Source: Ambulatory Visit | Attending: Surgery | Admitting: Surgery

## 2023-11-01 ENCOUNTER — Telehealth: Payer: Self-pay

## 2023-11-01 DIAGNOSIS — I6523 Occlusion and stenosis of bilateral carotid arteries: Secondary | ICD-10-CM

## 2023-11-01 LAB — POCT I-STAT CREATININE: Creatinine, Ser: 2.4 mg/dL — ABNORMAL HIGH (ref 0.44–1.00)

## 2023-11-01 MED ORDER — IOHEXOL 350 MG/ML SOLN: 80.0000 mL | Freq: Once | INTRAVENOUS | Status: DC | PRN

## 2023-11-01 NOTE — Telephone Encounter (Signed)
 Patient was unable to get her CTA done as her creatinine was elevated. Dr. Serene is aware and he ordered another US  to be done and have the patient follow up in the office with him. Patient's sister aware and requested that the US  be done over in [place]. Order placed for it to be done at [facility], with follow up here in [place] with Dr. Serene.

## 2023-11-04 ENCOUNTER — Ambulatory Visit: Admitting: Surgery

## 2023-11-19 IMAGING — US US RENAL
1 series · 14 of 25 positions shown · non-contrast
Comparison: Prior CT from 06/27/2018.

CLINICAL DATA: Initial evaluation for stage III B chronic kidney
disease.

EXAM:
RENAL / URINARY TRACT ULTRASOUND COMPLETE

[Series 1: us renal · 0.22mm/px · 14 of 44 slices shown]
[im 1/44]
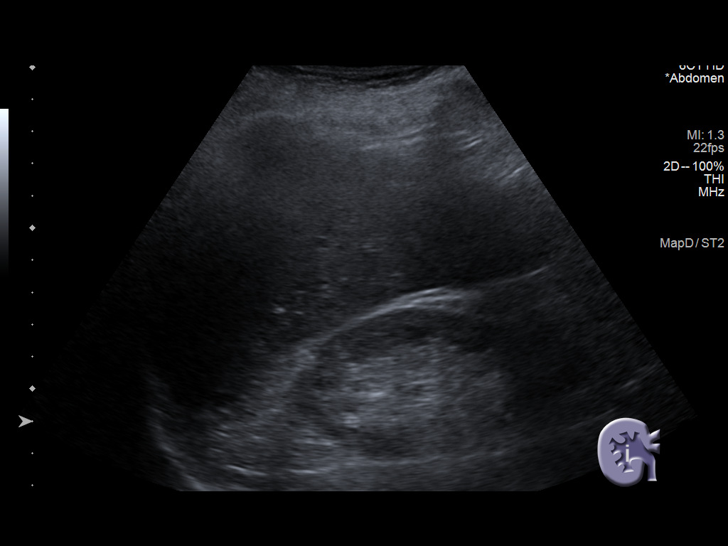
[im 4/44]
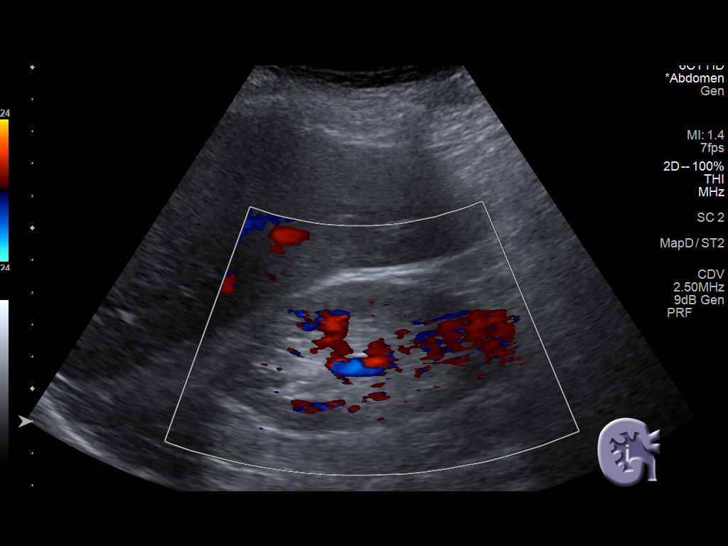
[im 8/44]
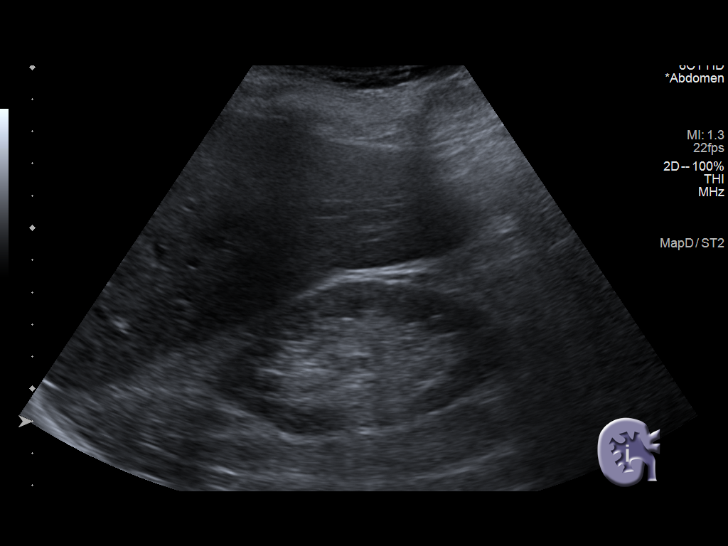
[im 11/44]
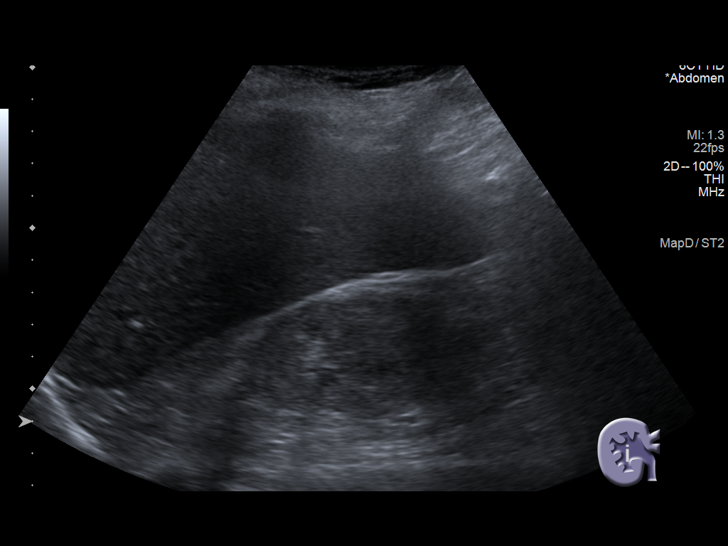
[im 15/44]
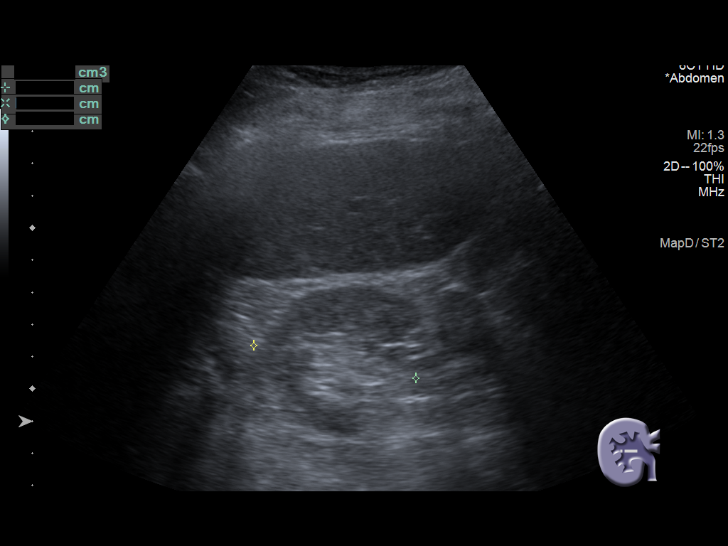
[im 17/44]
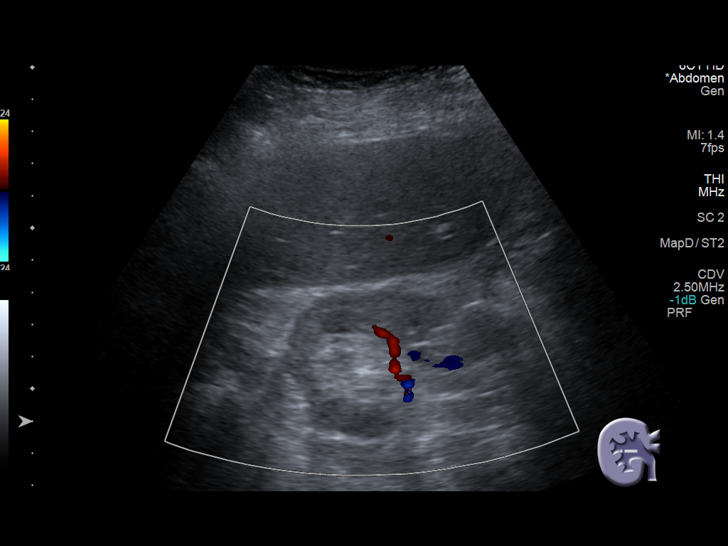
[im 20/44]
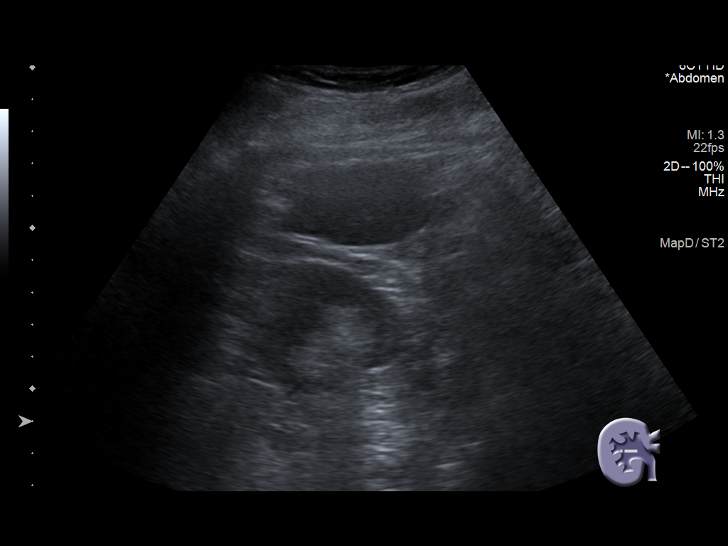
[im 24/44]
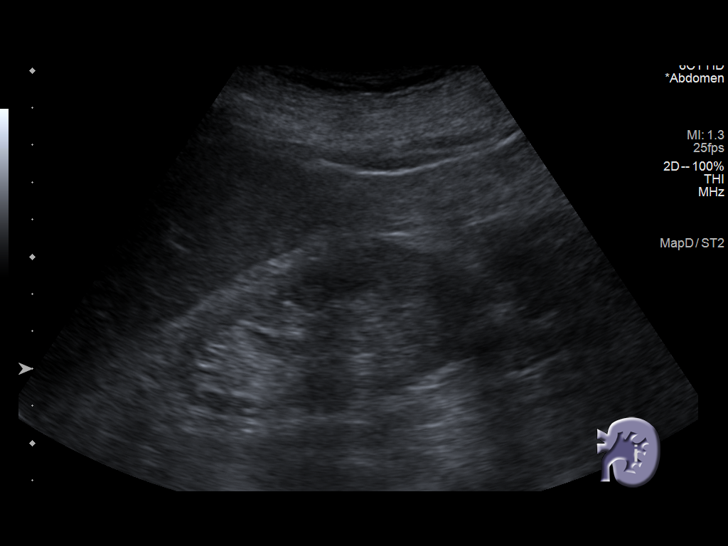
[im 27/44]
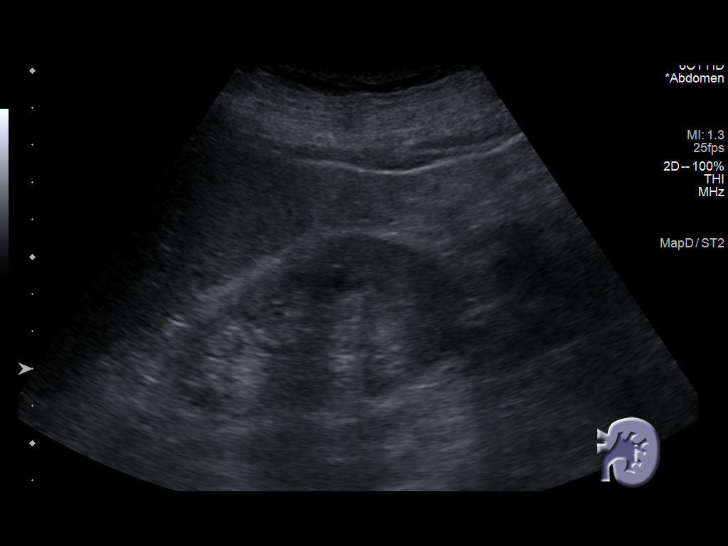
[im 29/44]
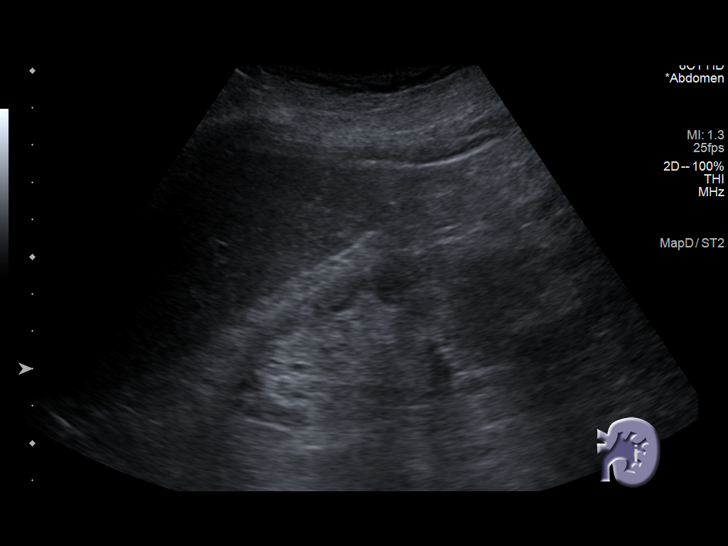
[im 33/44]
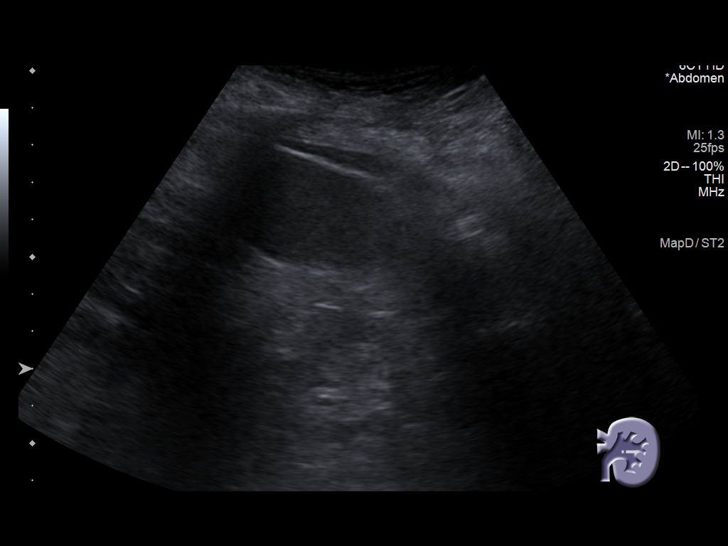
[im 36/44]
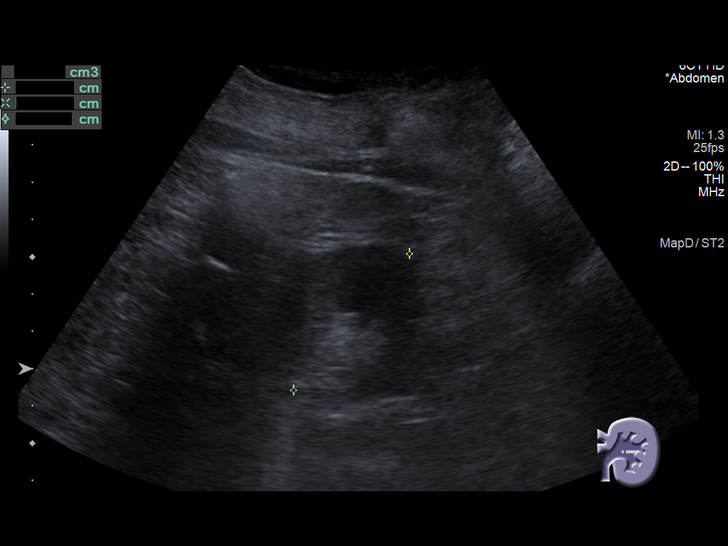
[im 40/44]
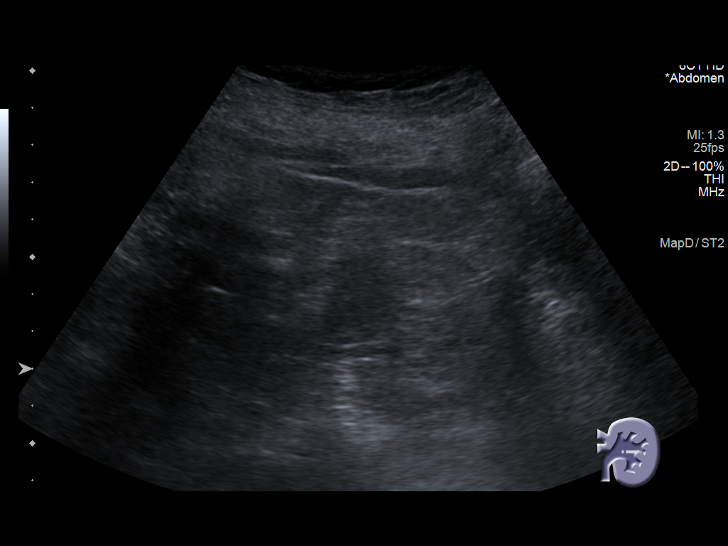
[im 44/44]
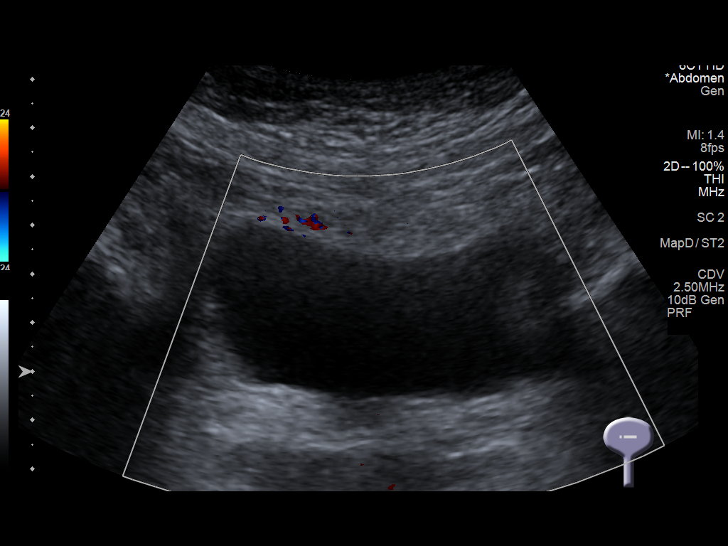

[14 of 25 positions shown; findings below may reference images not displayed]

FINDINGS: Right Kidney:

Renal measurements: 9.0 x 4.8 x 5.1 cm = volume: 114.7 mL. Renal
echogenicity within normal limits. No nephrolithiasis or
hydronephrosis. No focal renal mass.

Left Kidney:

Renal measurements: 8.1 x 4.4 x 4.8 cm = volume: 90.6 mL. Renal
echogenicity within normal limits. Diffuse cortical thinning noted
about the upper pole. No nephrolithiasis or hydronephrosis. No focal
renal mass.

Bladder:

Appears normal for degree of bladder distention.

Other:

None.
IMPRESSION: 1. Cortical thinning about the upper pole of the left kidney.
2. Otherwise unremarkable renal ultrasound. No hydronephrosis or
other significant finding.

## 2023-11-28 ENCOUNTER — Ambulatory Visit (HOSPITAL_COMMUNITY)
Admission: RE | Admit: 2023-11-28 | Discharge: 2023-11-28 | Disposition: A | Discharge status: Home / Self Care | Source: Ambulatory Visit | Attending: Surgery | Admitting: Surgery

## 2023-11-28 DIAGNOSIS — I6523 Occlusion and stenosis of bilateral carotid arteries: Secondary | ICD-10-CM | POA: Insufficient documentation

## 2023-12-10 ENCOUNTER — Ambulatory Visit (HOSPITAL_COMMUNITY)
Admission: RE | Admit: 2023-12-10 | Discharge: 2023-12-10 | Disposition: A | Discharge status: Home / Self Care | Source: Ambulatory Visit | Attending: Surgery | Admitting: Surgery

## 2023-12-10 ENCOUNTER — Other Ambulatory Visit: Payer: Self-pay | Admitting: Surgery

## 2023-12-10 DIAGNOSIS — I6523 Occlusion and stenosis of bilateral carotid arteries: Secondary | ICD-10-CM | POA: Diagnosis not present

## 2023-12-10 LAB — POCT I-STAT CREATININE: Creatinine, Ser: 2 mg/dL — ABNORMAL HIGH (ref 0.44–1.00)

## 2023-12-16 ENCOUNTER — Ambulatory Visit: Admitting: Surgery

## 2023-12-17 IMAGING — US US BIOPSY
1 series · 13 of 16 positions shown · non-contrast
Comparison: None.

INDICATION: 71-year-old female with history of chronic kidney disease.

EXAM:
ULTRASOUND GUIDED RENAL BIOPSY

[Series 1: us biopsy (kidney) · 13 of 16 slices shown]
[im 1/16]
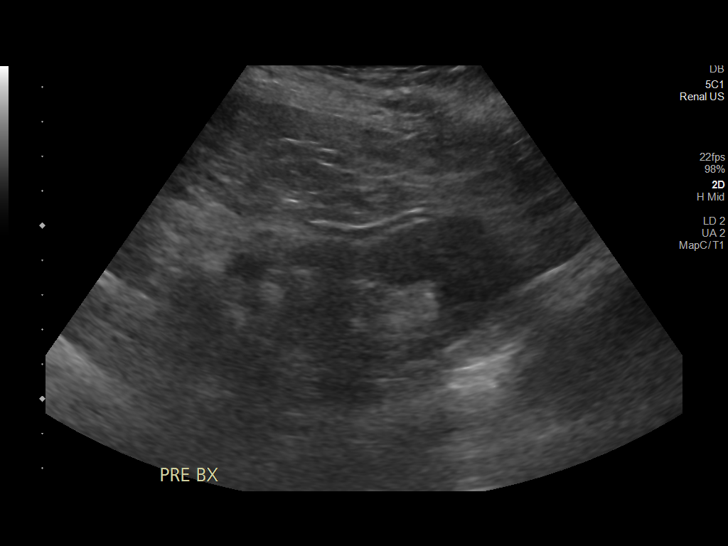
[im 2/16]
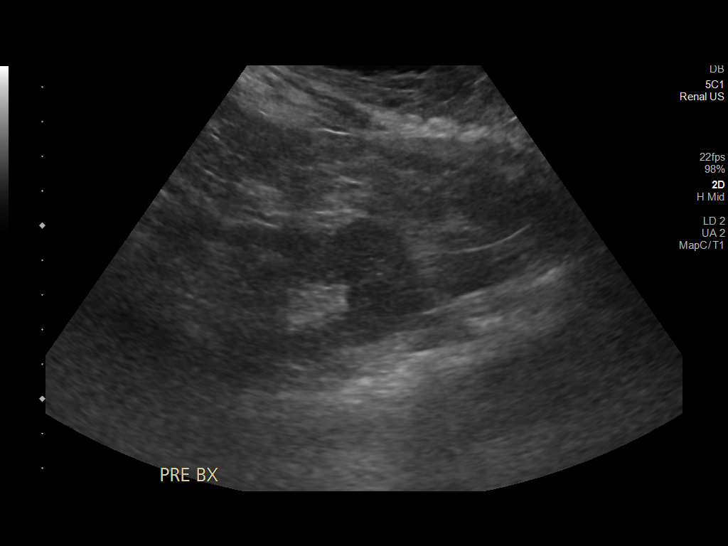
[im 4/16]
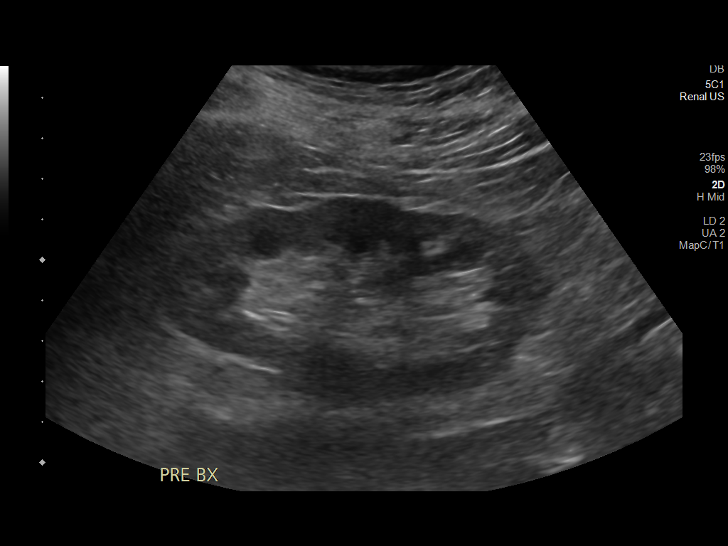
[im 5/16]
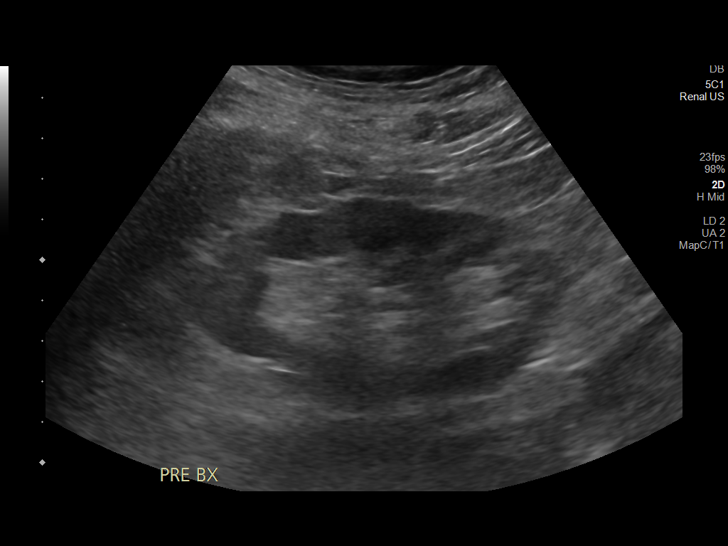
[im 6/16]
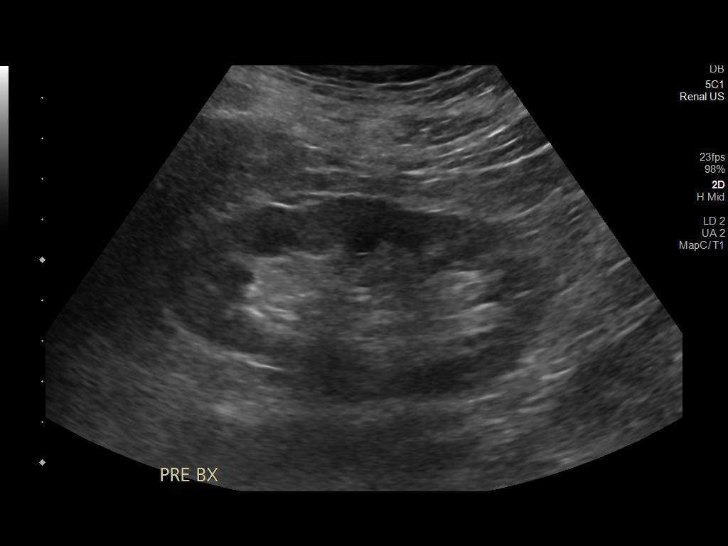
[im 7/16]
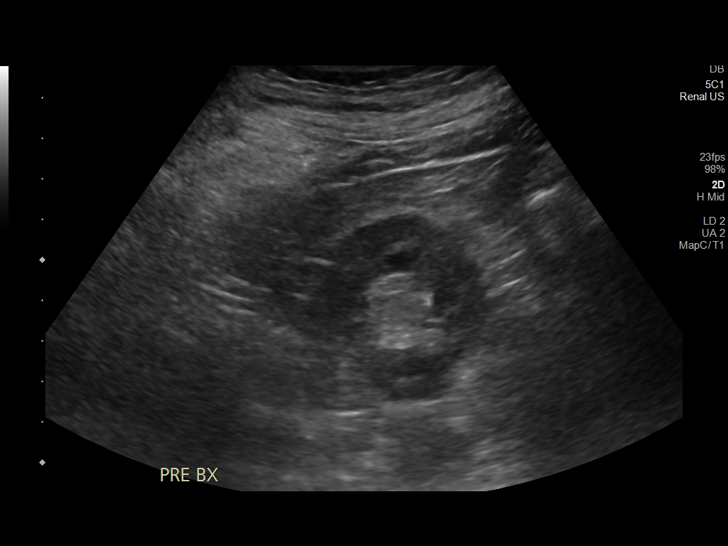
[im 9/16]
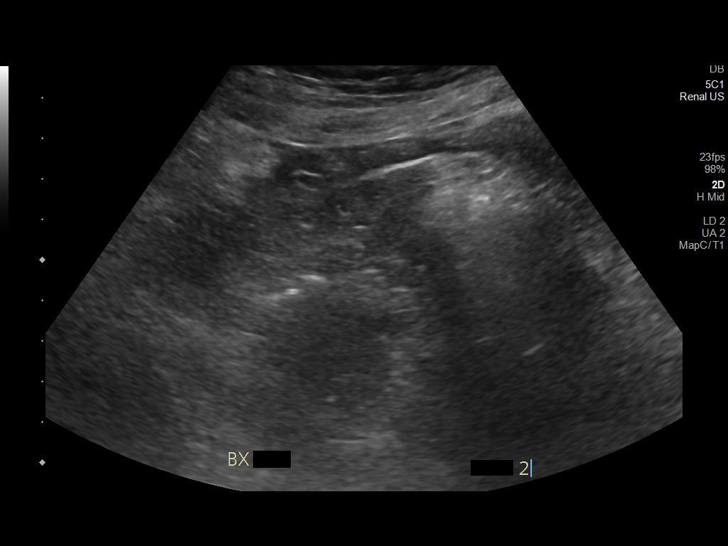
[im 10/16]
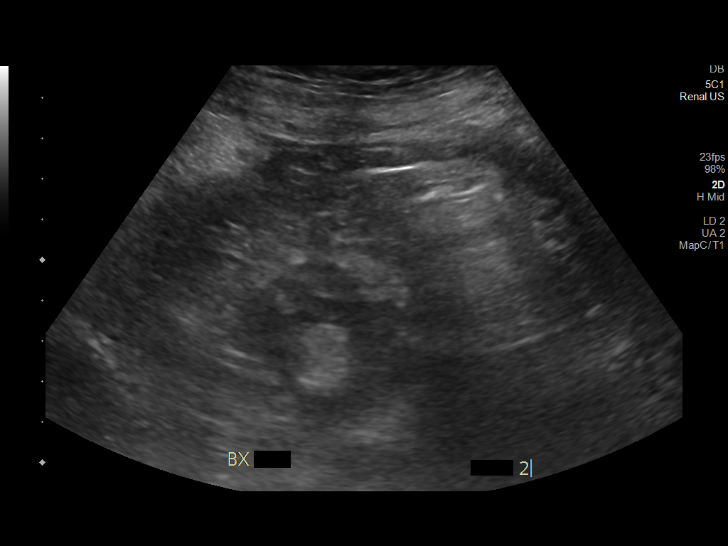
[im 11/16]
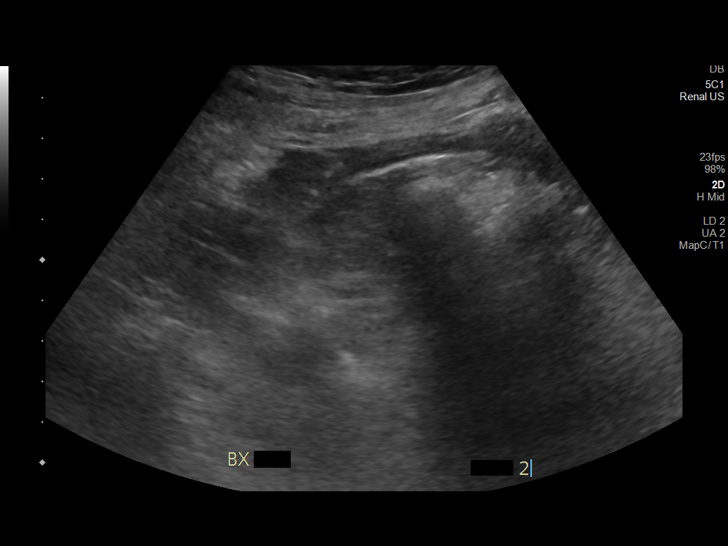
[im 12/16]
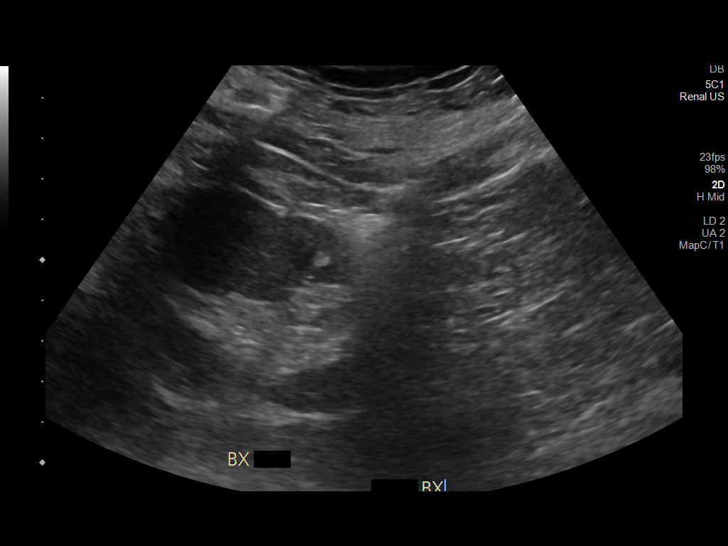
[im 13/16]
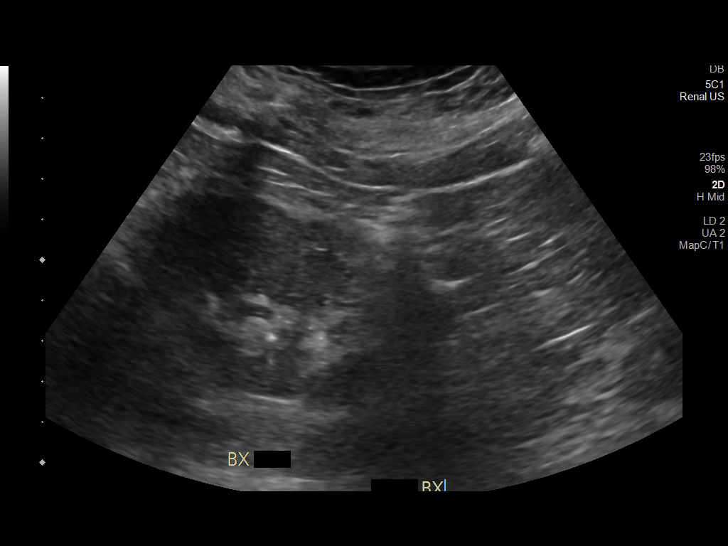
[im 15/16]
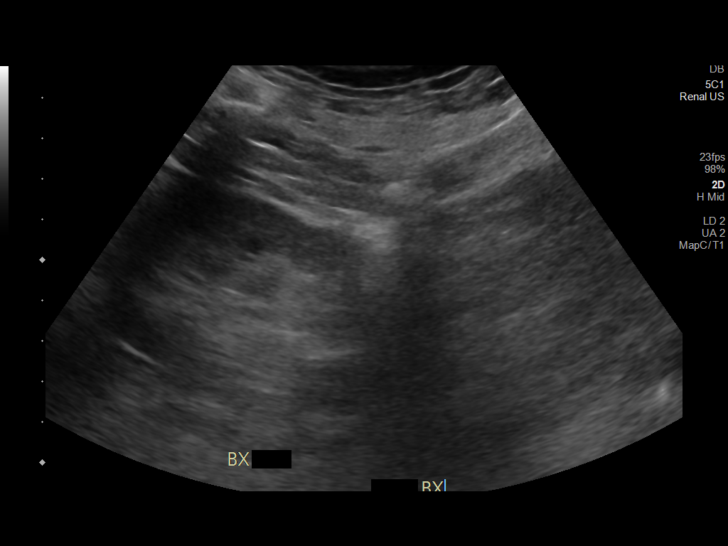
[im 16/16]
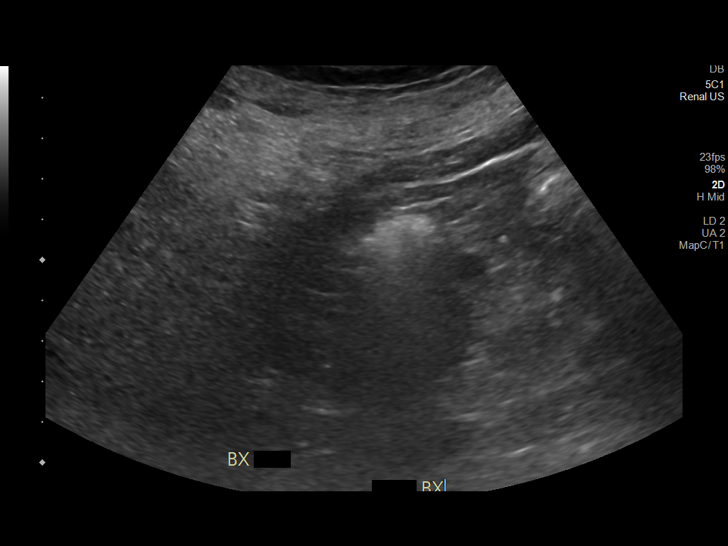

[13 of 16 positions shown; findings below may reference images not displayed]

MEDICATIONS:
5 mg hydralazine, intravenous

ANESTHESIA/SEDATION:
Fentanyl 25 mcg IV; Versed 1 mg IV

Total Moderate Sedation time: 12 minutes; The patient was
continuously monitored during the procedure by the interventional
radiology nurse under my direct supervision.

COMPLICATIONS:
None immediate.

PROCEDURE:
Informed written consent was obtained from the patient after a
discussion of the risks, benefits and alternatives to treatment. The
patient understands and consents the procedure. A timeout was
performed prior to the initiation of the procedure.

Ultrasound scanning was performed of the bilateral flanks. The
inferior pole of the right kidney was selected for biopsy due to
location and sonographic window. The procedure was planned. The
operative site was prepped and draped in the usual sterile fashion.
The overlying soft tissues were anesthetized with 1% lidocaine with
epinephrine. A 15 gauge coaxial introducer needle was advanced into
the inferior cortex of the right kidney and 2, 16 gauge core
biopsies were obtained under direct ultrasound guidance. Images were
saved for documentation purposes. The biopsy device was removed.
Gel-Foam slurry was administered under ultrasound guidance while
retracting the introducer needle. Hemostasis was obtained with
manual compression. Post procedural scanning was negative for
significant post procedural hemorrhage or additional complication. A
dressing was placed. The patient tolerated the procedure well
without immediate post procedural complication.
IMPRESSION: Technically successful ultrasound guided right renal biopsy.

## 2023-12-23 ENCOUNTER — Ambulatory Visit: Admitting: Surgery

## 2023-12-27 DIAGNOSIS — R809 Proteinuria, unspecified: Secondary | ICD-10-CM | POA: Diagnosis not present

## 2023-12-27 DIAGNOSIS — I129 Hypertensive chronic kidney disease with stage 1 through stage 4 chronic kidney disease, or unspecified chronic kidney disease: Secondary | ICD-10-CM | POA: Diagnosis not present

## 2023-12-27 DIAGNOSIS — E876 Hypokalemia: Secondary | ICD-10-CM | POA: Diagnosis not present

## 2023-12-27 DIAGNOSIS — E785 Hyperlipidemia, unspecified: Secondary | ICD-10-CM | POA: Diagnosis not present

## 2023-12-27 DIAGNOSIS — E559 Vitamin D deficiency, unspecified: Secondary | ICD-10-CM | POA: Diagnosis not present

## 2024-01-02 DIAGNOSIS — R809 Proteinuria, unspecified: Secondary | ICD-10-CM | POA: Diagnosis not present

## 2024-01-02 DIAGNOSIS — N184 Chronic kidney disease, stage 4 (severe): Secondary | ICD-10-CM | POA: Diagnosis not present

## 2024-01-02 DIAGNOSIS — I5032 Chronic diastolic (congestive) heart failure: Secondary | ICD-10-CM | POA: Diagnosis not present

## 2024-01-02 DIAGNOSIS — I129 Hypertensive chronic kidney disease with stage 1 through stage 4 chronic kidney disease, or unspecified chronic kidney disease: Secondary | ICD-10-CM | POA: Diagnosis not present

## 2024-01-09 ENCOUNTER — Other Ambulatory Visit (HOSPITAL_COMMUNITY): Payer: Self-pay | Admitting: Internal Medicine

## 2024-01-09 DIAGNOSIS — Z1231 Encounter for screening mammogram for malignant neoplasm of breast: Secondary | ICD-10-CM

## 2024-01-20 ENCOUNTER — Ambulatory Visit: Attending: Surgery | Admitting: Surgery

## 2024-01-20 ENCOUNTER — Encounter: Payer: Self-pay | Admitting: Surgery

## 2024-01-20 VITALS — BP 142/74 | HR 60 | Temp 97.8°F | Ht 66.0 in | Wt 155.0 lb

## 2024-01-20 DIAGNOSIS — I6523 Occlusion and stenosis of bilateral carotid arteries: Secondary | ICD-10-CM

## 2024-01-20 NOTE — Progress Notes (Signed)
 [facility] [place]  Patient name: Samantha Bruce MRN: [id number] DOB: [birth year] Sex: female   REASON FOR VISIT:    Follow-up  HISOTRY OF PRESENT ILLNESS:    Faelyn G Luecke is a 73 y.o. female who returns today for further evaluation of her carotid disease.  This was initially detected on ultrasound after carotid bruit was auscultated.  She has remained asymptomatic.  She denies numbness and weakness in either extremity.  She denies slurred speech.  She denies amaurosis fugax.  She initially came with an ultrasound report that showed left greater than right greater than 70% stenosis.  I sent her for a CT scan however because of her kidney issues she could not get contrast.  Patient is a smoker with COPD. She is medically managed for hypertension. She has a history of gout. She has been intolerant of statins.  PAST MEDICAL HISTORY:   Past Medical History:  Diagnosis Date   Anxiety    Chronic kidney disease    COPD (chronic obstructive pulmonary disease) (HCC)    Hyperlipidemia    Hypertension    Vertigo    Vitamin D deficiency      FAMILY HISTORY:   Family History  Problem Relation Age of Onset   Hypertension Mother    Heart attack Father    Diabetes Sister    Cancer Sister        breast   Hypertension Brother    Arthritis Brother        rheumatoid   Schizophrenia Son    Colon cancer Neg Hx     SOCIAL HISTORY:   Social History   Tobacco Use   Smoking status: Light Smoker    Current packs/day: 1.00    Average packs/day: 1 pack/day for 57.8 years (57.8 ttl pk-yrs)    Types: Cigarettes    Start date: 04/16/1966   Smokeless tobacco: Never   Tobacco comments:    1-2 cigarettes per day     Verified by [facility] 07/13/2022  Substance Use Topics   Alcohol  use: No     ALLERGIES:   Allergies  Allergen Reactions   Crestor  [Rosuvastatin ] Other (See Comments)    Myalgias, BLE swelling/weakness    Zetia  [Ezetimibe ] Other (See Comments)    Dizziness   Tylenol  With Codeine  #3 [Acetaminophen -Codeine ] Itching and Rash   Vicodin [Hydrocodone-Acetaminophen ] Itching and Rash     CURRENT MEDICATIONS:   Current Outpatient Medications  Medication Sig Dispense Refill   acetaminophen  (TYLENOL ) 500 MG tablet Take 500 mg by mouth every 6 (six) hours as needed for moderate pain.     allopurinol (ZYLOPRIM) 100 MG tablet Take 100 mg by mouth daily.     carvedilol (COREG) 25 MG tablet Take 25 mg by mouth 2 (two) times daily.     cholecalciferol (VITAMIN D3) 25 MCG (1000 UNIT) tablet Take 1,000 Units by mouth daily.     ezetimibe  (ZETIA ) 10 MG tablet Take 1 tablet by mouth daily.     indapamide (LOZOL) 2.5 MG tablet Take 5 mg by mouth daily.     NIFEdipine (PROCARDIA XL/NIFEDICAL XL) 60 MG 24 hr tablet Take 60 mg by mouth daily.     olmesartan (BENICAR) 5 MG tablet Take 5 mg by mouth daily.     Omega-3 Fatty Acids (FISH OIL) 1200 MG CAPS Take 1,200 mg by mouth daily.     potassium chloride  (MICRO-K ) 10 MEQ CR capsule Take 10 mEq by mouth daily.  prednisoLONE acetate (PRED FORTE) 1 % ophthalmic suspension Place 1 drop into the left eye 4 (four) times daily.     No current facility-administered medications for this visit.    REVIEW OF SYSTEMS:   [X]  denotes positive finding, [ ]  denotes negative finding Cardiac  Comments:  Chest pain or chest pressure:    Shortness of breath upon exertion:    Short of breath when lying flat:    Irregular heart rhythm:        Vascular    Pain in calf, thigh, or hip brought on by ambulation:    Pain in feet at night that wakes you up from your sleep:     Blood clot in your veins:    Leg swelling:         Pulmonary    Oxygen at home:    Productive cough:     Wheezing:         Neurologic    Sudden weakness in arms or legs:     Sudden numbness in arms or legs:     Sudden onset of difficulty speaking or slurred speech:    Temporary loss of vision in  one eye:     Problems with dizziness:         Gastrointestinal    Blood in stool:     Vomited blood:         Genitourinary    Burning when urinating:     Blood in urine:        Psychiatric    Major depression:         Hematologic    Bleeding problems:    Problems with blood clotting too easily:        Skin    Rashes or ulcers:        Constitutional    Fever or chills:      PHYSICAL EXAM:   Vitals:   01/20/24 0938 01/20/24 0940  BP: 122/78 (!) 142/74  Pulse: 60   Temp: 97.8 F (36.6 C)   SpO2: 95%   Weight: 155 lb (70.3 kg)   Height: 5' 6 (1.676 m)     GENERAL: The patient is a well-nourished female, in no acute distress. The vital signs are documented above. CARDIAC: There is a regular rate and rhythm.  PULMONARY: Non-labored respirations ABDOMEN: Soft and non-tender  MUSCULOSKELETAL: There are no major deformities or cyanosis. NEUROLOGIC: No focal weakness or paresthesias are detected. SKIN: There are no ulcers or rashes noted. PSYCHIATRIC: The patient has a normal affect.  STUDIES:   I have reviewed the following:  Carotid duplex: 1. Right carotid system: Moderate atherosclerotic plaque with estimated stenosis in the proximal internal carotid artery measuring between 50 and 69%. 2. Left carotid system: Moderate to severe atherosclerotic plaque with elevated velocity and carotid ratio. Estimated stenosis measures greater than 70% but less than near occlusion.   CT, noncontrast: Concentric calcified plaque of both carotid bifurcations  MEDICAL ISSUES:   Asymptomatic carotid stenosis: I tried to get a CT scan given her bilateral disease however this was not possible because of her renal insufficiency.  I repeated her ultrasound which was done up at [facility].  This showed 50 to 69% stenosis on the right and greater than 70% stenosis on the left.  After reviewing the studies, all of the end-diastolic velocities were less than 110, and so I did not feel  that she had stenosis greater than 80%.  I have recommended that we  continue with medical management.  She will have a repeat ultrasound in 6 months.  She would like to have this done in [place] as coming to [place] makes her very anxious.  I will arrange this with Dr. Gretta Malvina Serene MADISON, MD, [facility] [phone] Pager [phone]

## 2024-02-12 ENCOUNTER — Encounter (HOSPITAL_COMMUNITY): Payer: Self-pay

## 2024-02-12 ENCOUNTER — Inpatient Hospital Stay
Admission: RE | Admit: 2024-02-12 | Discharge: 2024-02-12 | Disposition: A | Discharge status: Home / Self Care | Payer: Self-pay | Source: Ambulatory Visit | Attending: Internal Medicine | Admitting: Internal Medicine

## 2024-02-12 ENCOUNTER — Other Ambulatory Visit (HOSPITAL_COMMUNITY): Payer: Self-pay | Admitting: Internal Medicine

## 2024-02-12 ENCOUNTER — Inpatient Hospital Stay
Admission: RE | Admit: 2024-02-12 | Discharge: 2024-02-12 | Disposition: A | Payer: Self-pay | Source: Ambulatory Visit | Attending: Internal Medicine | Admitting: Internal Medicine

## 2024-02-12 ENCOUNTER — Ambulatory Visit (HOSPITAL_COMMUNITY)
Admission: RE | Admit: 2024-02-12 | Discharge: 2024-02-12 | Disposition: A | Discharge status: Home / Self Care | Source: Ambulatory Visit | Attending: Internal Medicine | Admitting: Internal Medicine

## 2024-02-12 DIAGNOSIS — Z1231 Encounter for screening mammogram for malignant neoplasm of breast: Secondary | ICD-10-CM | POA: Diagnosis present

## 2024-04-03 ENCOUNTER — Ambulatory Visit: Attending: Nurse Practitioner | Admitting: Nurse Practitioner

## 2024-04-03 NOTE — Progress Notes (Unsigned)
 "  Office Visit    Patient Name: Samantha Bruce Date of Encounter: 10/11/2023 PCP:  Practice, [facility]  Cardiologist:  Diannah SHAUNNA [place]wood, MD  Advanced Practice Provider:  No care team member to display Electrophysiologist:  None   Chief Complaint and HPI    Samantha Bruce is a 74 y.o. female with a hx of coronary artery calcifications/CAD, hyperlipidemia, hypertension, tobacco abuse, who presents today for scheduled follow-up.  Last seen by Dr. Mallipeddi on June 11, 2022.  She was pending preoperative clearance for EGD and colonoscopy.  Due to imaging evidence of severe LM and coronary artery calcifications, was referred to cardiology for clearance.  Patient never had prior ischemic evaluation.  She is overall doing well from a cardiac perspective at the time.  Was noted she was smoking 3 cigarettes/day.  Was reported to be very active.  Was started on baby aspirin  and rosuvastatin  10 mg nightly.  ED visit 08/16/2022 for LOC and weakness. Had syncopal episode when walking to kitchen, daughter heard her fall to ground. Was felt that this was an orthostatic event, and pt was dehydrated. Found to have AKI, indapamide discontinued, started on amlodipine .   I last saw patient on November 02, 2022. Said her leg swelling had resolved and she had not had any more myalgias since stopping Crestor . Reported having gout 3 times since Christmas 2023. Denied any chest pain, shortness of breath, palpitations, syncope, presyncope, dizziness, orthopnea, PND, swelling or significant weight changes, acute bleeding, or claudication.  02/01/2023 - Today she presents for follow-up. Says she could not tolerate Zetia  since last office visit, medication made her dizzy. Since stopping medication, she has not had any more dizziness. Doing well from a cardiac perspective. Denies any chest pain, shortness of breath, palpitations, syncope, presyncope, dizziness,  orthopnea, PND, swelling or significant weight changes, acute bleeding, or claudication.  10/11/2023 -  Here for follow-up. Denies any acute cardiac complaints or issues. Denies any chest pain, shortness of breath, palpitations, syncope, presyncope, dizziness, orthopnea, PND, swelling or significant weight changes, acute bleeding, or claudication.  ROS: Negative. See HPI.   EKGs/Labs/Other Studies Reviewed:   The following studies were reviewed today:   EKG:       Echo 07/2021:    1. Left ventricular ejection fraction, by estimation, is 60 to 65%. The  left ventricle has normal function. The left ventricle has no regional  wall motion abnormalities. Left ventricular diastolic parameters are  consistent with Grade I diastolic  dysfunction (impaired relaxation). Elevated left atrial pressure.   2. Right ventricular systolic function is normal. The right ventricular  size is normal. Tricuspid regurgitation signal is inadequate for assessing  PA pressure.   3. Left atrial size was mildly dilated.   4. The mitral valve is abnormal. Mild mitral valve regurgitation. No  evidence of mitral stenosis.   5. The aortic valve is tricuspid. There is mild calcification of the  aortic valve. There is mild thickening of the aortic valve. Aortic valve  regurgitation is not visualized. No aortic stenosis is present.   6. The inferior vena cava is normal in size with greater than 50%  respiratory variability, suggesting right atrial pressure of 3 mmHg.    Review of Systems    All other systems reviewed and are otherwise negative except as noted above.  Physical Exam    VS:  There were no vitals taken for this visit. , BMI There is no height or weight  on file to calculate BMI.  Wt Readings from Last 3 Encounters:  01/20/24 155 lb (70.3 kg)  10/11/23 152 lb 6.4 oz (69.1 kg)  09/30/23 150 lb (68 kg)    GEN: Well nourished, well developed, in no acute distress. HEENT: normal. Neck: Supple, no  JVD, carotid bruits, or masses. Cardiac: S1/S2, RRR, no murmurs, rubs, or gallops. No clubbing, cyanosis. No edema along BLE Radials/PT 2+ and equal bilaterally.  Respiratory:  Respirations regular and unlabored, clear to auscultation bilaterally. Strong, nonproductive cough.  MS: No deformity or atrophy.  Skin: Warm and dry, no rash. Neuro:  Strength and sensation are intact. Psych: Normal affect.  Assessment & Plan    1. CAD Stable with no anginal symptoms. No indication for ischemic evaluation. Continue current medication regimen. Could not tolerate Crestor  and Zetia . Requesting labs as mentioned below. Heart healthy diet and regular cardiovascular exercise encouraged.   2. Hypertension BP well controlled. Discussed to monitor BP at home at least 2 hours after medications and sitting for 5-10 minutes. Continue current medication regimen.   3. HLD Intolerance to Crestor  and Zetia .  Will request labs from Dr. Trudy. Heart healthy diet and regular cardiovascular exercise encouraged. Plan to consider PCSK9i if labs revealed LDL not at goal.   4. CKD stage IV No recent labs on file. Avoid nephrotoxic agents. No medication changes at this time. Encouraged adequate hydration and to continue to follow with PCP and Nephrology. Will request labs from Dr. Trudy.   I spent a total duration of 30 minutes reviewing prior notes, reviewing outside records including  labs, EKG today, face-to-face counseling of  medical condition, pathophysiology, evaluation, management, and documenting the findings in the note.   Disposition: Follow up in 6 months with Vishnu P Mallipeddi, MD or APP.  Signed, Almarie Crate, NP "
